# Patient Record
Sex: Male | Born: 1995 | Race: White | Hispanic: No | Marital: Single | Smoking: Current every day smoker
Health system: Southern US, Community
[De-identification: ages and names within clinical notes are randomized; demographics above are authoritative.]

## PROBLEM LIST (undated history)

## (undated) DIAGNOSIS — F909 Attention-deficit hyperactivity disorder, unspecified type: Secondary | ICD-10-CM

## (undated) DIAGNOSIS — F99 Mental disorder, not otherwise specified: Secondary | ICD-10-CM

## (undated) HISTORY — PX: TONSILLECTOMY: SUR1361

---
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
## 2015-02-08 ENCOUNTER — Encounter: Payer: Self-pay | Admitting: Emergency Medicine

## 2015-02-08 DIAGNOSIS — S060X0A Concussion without loss of consciousness, initial encounter: Secondary | ICD-10-CM | POA: Diagnosis not present

## 2015-02-08 DIAGNOSIS — S0990XA Unspecified injury of head, initial encounter: Secondary | ICD-10-CM | POA: Diagnosis present

## 2015-02-08 DIAGNOSIS — Y998 Other external cause status: Secondary | ICD-10-CM | POA: Insufficient documentation

## 2015-02-08 DIAGNOSIS — Z79899 Other long term (current) drug therapy: Secondary | ICD-10-CM | POA: Diagnosis not present

## 2015-02-08 DIAGNOSIS — Y9366 Activity, soccer: Secondary | ICD-10-CM | POA: Insufficient documentation

## 2015-02-08 DIAGNOSIS — W2102XA Struck by soccer ball, initial encounter: Secondary | ICD-10-CM | POA: Diagnosis not present

## 2015-02-08 DIAGNOSIS — Y92322 Soccer field as the place of occurrence of the external cause: Secondary | ICD-10-CM | POA: Diagnosis not present

## 2015-02-08 NOTE — ED Notes (Signed)
Patient ambulatory to triage with steady gait, without difficulty or distress noted; pt reports hit to bridge of nose with soccer ball at 10pm ; denies LOC but c/o generalized HA since

## 2015-02-09 ENCOUNTER — Emergency Department
Admission: EM | Admit: 2015-02-09 | Discharge: 2015-02-09 | Disposition: A | Discharge status: Home / Self Care | Payer: PRIVATE HEALTH INSURANCE | Attending: Emergency Medicine | Admitting: Emergency Medicine

## 2015-02-09 ENCOUNTER — Emergency Department: Payer: PRIVATE HEALTH INSURANCE

## 2015-02-09 DIAGNOSIS — S060X0A Concussion without loss of consciousness, initial encounter: Secondary | ICD-10-CM

## 2015-02-09 MED ORDER — IBUPROFEN 800 MG PO TABS
ORAL_TABLET | ORAL | Status: AC
  Filled 2015-02-09: qty 1

## 2015-02-09 MED ORDER — IBUPROFEN 800 MG PO TABS
800.0000 mg | ORAL_TABLET | ORAL | Status: AC
  Administered 2015-02-09: 800 mg via ORAL

## 2015-02-09 NOTE — Discharge Instructions (Signed)
Concussion, Adult  A concussion, or closed-head injury, is a brain injury caused by a direct blow to the head or by a quick and sudden movement (jolt) of the head or neck. Concussions are usually not life-threatening. Even so, the effects of a concussion can be serious. If you have had a concussion before, you are more likely to experience concussion-like symptoms after a direct blow to the head.   CAUSES  · Direct blow to the head, such as from running into another player during a soccer game, being hit in a fight, or hitting your head on a hard surface.  · A jolt of the head or neck that causes the brain to move back and forth inside the skull, such as in a car crash.  SIGNS AND SYMPTOMS  The signs of a concussion can be hard to notice. Early on, they may be missed by you, family members, and health care providers. You may look fine but act or feel differently.  Symptoms are usually temporary, but they may last for days, weeks, or even longer. Some symptoms may appear right away while others may not show up for hours or days. Every head injury is different. Symptoms include:  · Mild to moderate headaches that will not go away.  · A feeling of pressure inside your head.  · Having more trouble than usual:    Learning or remembering things you have heard.    Answering questions.    Paying attention or concentrating.    Organizing daily tasks.    Making decisions and solving problems.  · Slowness in thinking, acting or reacting, speaking, or reading.  · Getting lost or being easily confused.  · Feeling tired all the time or lacking energy (fatigued).  · Feeling drowsy.  · Sleep disturbances.    Sleeping more than usual.    Sleeping less than usual.    Trouble falling asleep.    Trouble sleeping (insomnia).  · Loss of balance or feeling lightheaded or dizzy.  · Nausea or vomiting.  · Numbness or tingling.  · Increased sensitivity to:    Sounds.    Lights.    Distractions.  · Vision problems or eyes that tire  easily.  · Diminished sense of taste or smell.  · Ringing in the ears.  · Mood changes such as feeling sad or anxious.  · Becoming easily irritated or angry for little or no reason.  · Lack of motivation.  · Seeing or hearing things other people do not see or hear (hallucinations).  DIAGNOSIS  Your health care provider can usually diagnose a concussion based on a description of your injury and symptoms. He or she will ask whether you passed out (lost consciousness) and whether you are having trouble remembering events that happened right before and during your injury.  Your evaluation might include:  · A brain scan to look for signs of injury to the brain. Even if the test shows no injury, you may still have a concussion.  · Blood tests to be sure other problems are not present.  TREATMENT  · Concussions are usually treated in an emergency department, in urgent care, or at a clinic. You may need to stay in the hospital overnight for further treatment.  · Tell your health care provider if you are taking any medicines, including prescription medicines, over-the-counter medicines, and natural remedies. Some medicines, such as blood thinners (anticoagulants) and aspirin, may increase the chance of complications. Also tell your health care   provider whether you have had alcohol or are taking illegal drugs. This information may affect treatment.  · Your health care provider will send you home with important instructions to follow.  · How fast you will recover from a concussion depends on many factors. These factors include how severe your concussion is, what part of your brain was injured, your age, and how healthy you were before the concussion.  · Most people with mild injuries recover fully. Recovery can take time. In general, recovery is slower in older persons. Also, persons who have had a concussion in the past or have other medical problems may find that it takes longer to recover from their current injury.  HOME  CARE INSTRUCTIONS  General Instructions  · Carefully follow the directions your health care provider gave you.  · Only take over-the-counter or prescription medicines for pain, discomfort, or fever as directed by your health care provider.  · Take only those medicines that your health care provider has approved.  · Do not drink alcohol until your health care provider says you are well enough to do so. Alcohol and certain other drugs may slow your recovery and can put you at risk of further injury.  · If it is harder than usual to remember things, write them down.  · If you are easily distracted, try to do one thing at a time. For example, do not try to watch TV while fixing dinner.  · Talk with family members or close friends when making important decisions.  · Keep all follow-up appointments. Repeated evaluation of your symptoms is recommended for your recovery.  · Watch your symptoms and tell others to do the same. Complications sometimes occur after a concussion. Older adults with a brain injury may have a higher risk of serious complications, such as a blood clot on the brain.  · Tell your teachers, school nurse, school counselor, coach, athletic trainer, or work manager about your injury, symptoms, and restrictions. Tell them about what you can or cannot do. They should watch for:    Increased problems with attention or concentration.    Increased difficulty remembering or learning new information.    Increased time needed to complete tasks or assignments.    Increased irritability or decreased ability to cope with stress.    Increased symptoms.  · Rest. Rest helps the brain to heal. Make sure you:    Get plenty of sleep at night. Avoid staying up late at night.    Keep the same bedtime hours on weekends and weekdays.    Rest during the day. Take daytime naps or rest breaks when you feel tired.  · Limit activities that require a lot of thought or concentration. These include:    Doing homework or job-related  work.    Watching TV.    Working on the computer.  · Avoid any situation where there is potential for another head injury (football, hockey, soccer, basketball, martial arts, downhill snow sports and horseback riding). Your condition will get worse every time you experience a concussion. You should avoid these activities until you are evaluated by the appropriate follow-up health care providers.  Returning To Your Regular Activities  You will need to return to your normal activities slowly, not all at once. You must give your body and brain enough time for recovery.  · Do not return to sports or other athletic activities until your health care provider tells you it is safe to do so.  · Ask   your health care provider when you can drive, ride a bicycle, or operate heavy machinery. Your ability to react may be slower after a brain injury. Never do these activities if you are dizzy.  · Ask your health care provider about when you can return to work or school.  Preventing Another Concussion  It is very important to avoid another brain injury, especially before you have recovered. In rare cases, another injury can lead to permanent brain damage, brain swelling, or death. The risk of this is greatest during the first 7-10 days after a head injury. Avoid injuries by:  · Wearing a seat belt when riding in a car.  · Drinking alcohol only in moderation.  · Wearing a helmet when biking, skiing, skateboarding, skating, or doing similar activities.  · Avoiding activities that could lead to a second concussion, such as contact or recreational sports, until your health care provider says it is okay.  · Taking safety measures in your home.    Remove clutter and tripping hazards from floors and stairways.    Use grab bars in bathrooms and handrails by stairs.    Place non-slip mats on floors and in bathtubs.    Improve lighting in dim areas.  SEEK MEDICAL CARE IF:  · You have increased problems paying attention or  concentrating.  · You have increased difficulty remembering or learning new information.  · You need more time to complete tasks or assignments than before.  · You have increased irritability or decreased ability to cope with stress.  · You have more symptoms than before.  Seek medical care if you have any of the following symptoms for more than 2 weeks after your injury:  · Lasting (chronic) headaches.  · Dizziness or balance problems.  · Nausea.  · Vision problems.  · Increased sensitivity to noise or light.  · Depression or mood swings.  · Anxiety or irritability.  · Memory problems.  · Difficulty concentrating or paying attention.  · Sleep problems.  · Feeling tired all the time.  SEEK IMMEDIATE MEDICAL CARE IF:  · You have severe or worsening headaches. These may be a sign of a blood clot in the brain.  · You have weakness (even if only in one hand, leg, or part of the face).  · You have numbness.  · You have decreased coordination.  · You vomit repeatedly.  · You have increased sleepiness.  · One pupil is larger than the other.  · You have convulsions.  · You have slurred speech.  · You have increased confusion. This may be a sign of a blood clot in the brain.  · You have increased restlessness, agitation, or irritability.  · You are unable to recognize people or places.  · You have neck pain.  · It is difficult to wake you up.  · You have unusual behavior changes.  · You lose consciousness.  MAKE SURE YOU:  · Understand these instructions.  · Will watch your condition.  · Will get help right away if you are not doing well or get worse.     This information is not intended to replace advice given to you by your health care provider. Make sure you discuss any questions you have with your health care provider.     Document Released: 06/02/2003 Document Revised: 04/02/2014 Document Reviewed: 10/02/2012  Elsevier Interactive Patient Education ©2016 [employer]

## 2015-02-09 NOTE — ED Notes (Signed)
Pt dc home ambulatory with friend instructed on follow up plan PT NAD AT DC

## 2015-02-09 NOTE — ED Provider Notes (Signed)
[facility] Emergency Department Provider Note  ____________________________________________  Time seen: 2:14 AM  I have reviewed the triage vital signs and the nursing notes.   HISTORY  Chief Complaint Headache      HPI Cory Joseph is a 19 y.o. male presents with history of being struck on the bridge of the nose today by a soccer ball at 10 PM. Patient denies any loss of consciousness at the time however does complain of generalized headache mainly frontal and occipital and unsteady gait. Patient denies any leakage of  fluid from the nose    History reviewed. No pertinent past medical history.  Patient Active Problem List   Diagnosis Date Noted  . Pain in shin 03/18/2012  . Gait abnormality 03/18/2012    Past Surgical History  Procedure Laterality Date  . Tonsillectomy      Current Outpatient Rx  Name  Route  Sig  Dispense  Refill  . cefPROZIL (CEFZIL) 250 MG/5ML suspension            0   . CONCERTA 54 MG CR tablet   Oral   Take 54 mg by mouth every morning.      0     Dispense as written.   . methylphenidate (RITALIN) 5 MG tablet      take 1 to 2 tablets by mouth AFTER SCHOOL      0   . montelukast (SINGULAIR) 10 MG tablet   Oral   Take 10 mg by mouth every morning.      0   . traMADol (ULTRAM) 50 MG tablet      take 1 tablet by mouth every 4 to 6 hours if needed for pain      0     Allergies Hydrocodeine  No family history on file.  Social History Social History  Substance Use Topics  . Smoking status: Never Smoker   . Smokeless tobacco: None  . Alcohol Use: None    Review of Systems  Constitutional: Negative for fever. Eyes: Negative for visual changes. ENT: Negative for sore throat. Cardiovascular: Negative for chest pain. Respiratory: Negative for shortness of breath. Gastrointestinal: Negative for abdominal pain, vomiting and diarrhea. Genitourinary: Negative for dysuria. Musculoskeletal:  Negative for back pain. Skin: Negative for rash. Neurological: Positive for headache and unsteady gait.   10-point ROS otherwise negative.  ____________________________________________   PHYSICAL EXAM:  VITAL SIGNS: ED Triage Vitals  Enc Vitals Group     BP 02/09/15 0208 139/79 mmHg     Pulse Rate 02/09/15 0208 72     Resp 02/09/15 0208 16     Temp --      Temp src --      SpO2 02/09/15 0208 100 %     Weight --      Height --      Head Cir --      Peak Flow --      Pain Score 02/08/15 2345 7     Pain Loc --      Pain Edu? --      Excl. in GC? --      Constitutional: Alert and oriented. Well appearing and in no distress. Eyes: Conjunctivae are normal. PERRL. Normal extraocular movements. ENT   Head: Normocephalic and atraumatic.   Nose: No congestion/rhinnorhea.   Mouth/Throat: Mucous membranes are moist.   Neck: No stridor. Hematological/Lymphatic/Immunilogical: No cervical lymphadenopathy. Cardiovascular: Normal rate, regular rhythm. Normal and symmetric distal pulses are present in all  extremities. No murmurs, rubs, or gallops. Respiratory: Normal respiratory effort without tachypnea nor retractions. Breath sounds are clear and equal bilaterally. No wheezes/rales/rhonchi. Gastrointestinal: Soft and nontender. No distention. There is no CVA tenderness. Genitourinary: deferred Musculoskeletal: Nontender with normal range of motion in all extremities. No joint effusions.  No lower extremity tenderness nor edema. Neurologic:  Normal speech and language. No gross focal neurologic deficits are appreciated. Speech is normal.  Skin:  Skin is warm, dry and intact. No rash noted. Psychiatric: Mood and affect are normal. Speech and behavior are normal. Patient exhibits appropriate insight and judgment.   RADIOLOGY  CT Head Wo Contrast (Final result) Result time: 02/09/15 02:46:25   Final result by Rad Results In Interface (02/09/15 02:46:25)   Narrative:    CLINICAL DATA: Generalized headache after being hit in bridge of nose with soccer ball. Initial encounter.  EXAM: CT HEAD WITHOUT CONTRAST  TECHNIQUE: Contiguous axial images were obtained from the base of the skull through the vertex without intravenous contrast.  COMPARISON: None.  FINDINGS: There is no evidence of acute infarction, mass lesion, or intra- or extra-axial hemorrhage on CT.  The posterior fossa, including the cerebellum, brainstem and fourth ventricle, is within normal limits. The third and lateral ventricles, and basal ganglia are unremarkable in appearance. The cerebral hemispheres are symmetric in appearance, with normal gray-white differentiation. No mass effect or midline shift is seen.  There is no evidence of fracture; visualized osseous structures are unremarkable in appearance. The visualized portions of the orbits are within normal limits. The paranasal sinuses and mastoid air cells are well-aerated. No significant soft tissue abnormalities are seen.  IMPRESSION: No evidence of traumatic intracranial injury or fracture.   Electronically Signed By: Roanna RaiderJeffery Chang M.D. On: 02/09/2015 02:46    INITIAL IMPRESSION / ASSESSMENT AND PLAN / ED COURSE  Pertinent labs & imaging results that were available during my care of the patient were reviewed by me and considered in my medical decision making (see chart for details).  History and physical exam consistent with possible concussion. As such patient was advised to avoid contact sports for at least 2 weeks to limit possibility of a recurrent headache injury. Patient recommended to follow up with primary care provider  ____________________________________________   FINAL CLINICAL IMPRESSION(S) / ED DIAGNOSES  Final diagnoses:  Concussion, without loss of consciousness, initial encounter      Darci Currentandolph N Brown, MD 02/09/15 [phone]

## 2015-02-09 NOTE — ED Notes (Addendum)
MD at bedside. 

## 2015-05-10 ENCOUNTER — Encounter: Payer: Self-pay | Admitting: Emergency Medicine

## 2015-05-10 ENCOUNTER — Emergency Department
Admission: EM | Admit: 2015-05-10 | Discharge: 2015-05-10 | Disposition: A | Discharge status: Home / Self Care | Payer: PRIVATE HEALTH INSURANCE | Attending: Emergency Medicine | Admitting: Emergency Medicine

## 2015-05-10 ENCOUNTER — Emergency Department: Payer: PRIVATE HEALTH INSURANCE

## 2015-05-10 DIAGNOSIS — Y998 Other external cause status: Secondary | ICD-10-CM | POA: Insufficient documentation

## 2015-05-10 DIAGNOSIS — Y9367 Activity, basketball: Secondary | ICD-10-CM | POA: Insufficient documentation

## 2015-05-10 DIAGNOSIS — Z79899 Other long term (current) drug therapy: Secondary | ICD-10-CM | POA: Insufficient documentation

## 2015-05-10 DIAGNOSIS — Y92310 Basketball court as the place of occurrence of the external cause: Secondary | ICD-10-CM | POA: Diagnosis not present

## 2015-05-10 DIAGNOSIS — S060X0A Concussion without loss of consciousness, initial encounter: Secondary | ICD-10-CM | POA: Diagnosis not present

## 2015-05-10 DIAGNOSIS — S0990XA Unspecified injury of head, initial encounter: Secondary | ICD-10-CM | POA: Diagnosis present

## 2015-05-10 DIAGNOSIS — W01198A Fall on same level from slipping, tripping and stumbling with subsequent striking against other object, initial encounter: Secondary | ICD-10-CM | POA: Insufficient documentation

## 2015-05-10 NOTE — ED Notes (Signed)
Pt arrived to the ED for complaints of having a headache for 4 days after sustaining a head injury while playing basketball. Pt states that he got hit by a basketball, denies LOC and states that came because he had a concussion in the past. Reports that Tylenol helps with the headache. Pt is AOx4 in no apparent distress.

## 2015-05-10 NOTE — ED Provider Notes (Signed)
[facility] Emergency Department Provider Note  ____________________________________________  Time seen: Approximately 8:48 PM  I have reviewed the triage vital signs and the nursing notes.   HISTORY  Chief Complaint Headache and Head Injury    HPI ISMAEL KARGE is a 20 y.o. male who fell from a standing position while playing basketball 4 days ago. He landed on his side and his head hit the concrete on the left temple region. No loss of consciousness or nausea or vomiting. He felt dazed and some vision changes. He presents to the emergency room because his pain persist and he has been acting out of character. No loss of balance. He has been able to perform his normal school functions at [employer]. He has a history of concussion approximate 2 months ago.   History reviewed. No pertinent past medical history.  Patient Active Problem List   Diagnosis Date Noted  . Pain in shin 03/18/2012  . Gait abnormality 03/18/2012    Past Surgical History  Procedure Laterality Date  . Tonsillectomy      Current Outpatient Rx  Name  Route  Sig  Dispense  Refill  . cefPROZIL (CEFZIL) 250 MG/5ML suspension            0   . CONCERTA 54 MG CR tablet   Oral   Take 54 mg by mouth every morning.      0     Dispense as written.   . methylphenidate (RITALIN) 5 MG tablet      take 1 to 2 tablets by mouth AFTER SCHOOL      0   . montelukast (SINGULAIR) 10 MG tablet   Oral   Take 10 mg by mouth every morning.      0   . traMADol (ULTRAM) 50 MG tablet      take 1 tablet by mouth every 4 to 6 hours if needed for pain      0     Allergies Hydrocodeine  History reviewed. No pertinent family history.  Social History Social History  Substance Use Topics  . Smoking status: Never Smoker   . Smokeless tobacco: None  . Alcohol Use: Yes    Review of Systems Constitutional: No fever/chills Eyes: No visual changes. ENT: No sore  throat. Cardiovascular: Denies chest pain. Respiratory: Denies shortness of breath. Gastrointestinal: No abdominal pain.  No nausea, no vomiting.  No diarrhea.  No constipation. Genitourinary: Negative for dysuria. Musculoskeletal: Negative for back pain. Skin: Negative for rash. Neurological: Negative for headaches, focal weakness or numbness. Cranial nerves II through XII grossly intact. Neg Romberg. Normal cerebellar function. Normal gait. 10-point ROS otherwise negative.  ____________________________________________   PHYSICAL EXAM:  VITAL SIGNS: ED Triage Vitals  Enc Vitals Group     BP 05/10/15 1925 141/73 mmHg     Pulse Rate 05/10/15 1925 52     Resp 05/10/15 1925 16     Temp 05/10/15 1925 97.7 F (36.5 C)     Temp Source 05/10/15 1925 Oral     SpO2 05/10/15 1925 98 %     Weight 05/10/15 1925 185 lb (83.915 kg)     Height 05/10/15 1925  (1.753 m)     Head Cir --      Peak Flow --      Pain Score 05/10/15 1926 8     Pain Loc --      Pain Edu? --      Excl. in GC? --  Constitutional: Alert and oriented. Well appearing and in no acute distress. Eyes: Conjunctivae are normal. PERRL. EOMI. Ears:  Clear with normal landmarks. No erythema. Head: Atraumatic. Nose: No congestion/rhinnorhea. Mouth/Throat: Mucous membranes are moist.  Oropharynx non-erythematous. No lesions. Neck:  Supple.  No adenopathy.  No cervical spine tenderness to palpation. Cardiovascular: Normal rate, regular rhythm. Grossly normal heart sounds.  Good peripheral circulation. Respiratory: Normal respiratory effort.  No retractions. Lungs CTAB. Gastrointestinal: Soft and nontender. No distention. No abdominal bruits. No CVA tenderness. Musculoskeletal: Nml ROM of upper and lower extremity joints. Neurologic:  Normal speech and language. No gross focal neurologic deficits are appreciated. No gait instability. Skin:  Skin is warm, dry and intact. No rash noted. Psychiatric: Mood and affect are  normal. Speech and behavior are normal.  ____________________________________________   LABS (all labs ordered are listed, but only abnormal results are displayed)  Labs Reviewed - No data to display ____________________________________________  EKG   ____________________________________________  RADIOLOGY  CLINICAL DATA: Headache for 4 days since a head injury playing basketball. Initial encounter.  EXAM: CT HEAD WITHOUT CONTRAST  TECHNIQUE: Contiguous axial images were obtained from the base of the skull through the vertex without intravenous contrast.  COMPARISON: Head CT scan 02/09/2008.  FINDINGS: The brain appears normal without hemorrhage, infarct, mass lesion, mass effect, midline shift or abnormal extra-axial fluid collection. No hydrocephalus or pneumocephalus. The calvarium is intact. Imaged paranasal sinuses and mastoid air cells are clear.  IMPRESSION: Negative head CT.   Electronically Signed  By: Drusilla Kanner M.D.  On: 05/10/2015 20:52  ____________________________________________   PROCEDURES  Procedure(s) performed: None  Critical Care performed: No  ____________________________________________   INITIAL IMPRESSION / ASSESSMENT AND PLAN / ED COURSE  Pertinent labs & imaging results that were available during my care of the patient were reviewed by me and considered in my medical decision making (see chart for details).  20 year old who suffered a fall while playing basketball hitting his head on the ground. Presents with persistent pain to the left temple with headache and behaviors out of character. CT of the head was negative. Treated for concussion with close follow-up by his pediatrician. Encouraged no sport activities or intense mental activities. ____________________________________________   FINAL CLINICAL IMPRESSION(S) / ED DIAGNOSES  Final diagnoses:  Concussion, without loss of consciousness, initial  encounter      Ignacia Bayley, PA-C 05/10/15 2106  Sharman Cheek, MD 05/10/15 2317

## 2015-05-10 NOTE — Discharge Instructions (Signed)
Concussion, Adult A concussion, or closed-head injury, is a brain injury caused by a direct blow to the head or by a quick and sudden movement (jolt) of the head or neck. Concussions are usually not life-threatening. Even so, the effects of a concussion can be serious. If you have had a concussion before, you are more likely to experience concussion-like symptoms after a direct blow to the head.  CAUSES  Direct blow to the head, such as from running into another player during a soccer game, being hit in a fight, or hitting your head on a hard surface.  A jolt of the head or neck that causes the brain to move back and forth inside the skull, such as in a car crash. SIGNS AND SYMPTOMS The signs of a concussion can be hard to notice. Early on, they may be missed by you, family members, and health care providers. You may look fine but act or feel differently. Symptoms are usually temporary, but they may last for days, weeks, or even longer. Some symptoms may appear right away while others may not show up for hours or days. Every head injury is different. Symptoms include:  Mild to moderate headaches that will not go away.  A feeling of pressure inside your head.  Having more trouble than usual:  Learning or remembering things you have heard.  Answering questions.  Paying attention or concentrating.  Organizing daily tasks.  Making decisions and solving problems.  Slowness in thinking, acting or reacting, speaking, or reading.  Getting lost or being easily confused.  Feeling tired all the time or lacking energy (fatigued).  Feeling drowsy.  Sleep disturbances.  Sleeping more than usual.  Sleeping less than usual.  Trouble falling asleep.  Trouble sleeping (insomnia).  Loss of balance or feeling lightheaded or dizzy.  Nausea or vomiting.  Numbness or tingling.  Increased sensitivity to:  Sounds.  Lights.  Distractions.  Vision problems or eyes that tire  easily.  Diminished sense of taste or smell.  Ringing in the ears.  Mood changes such as feeling sad or anxious.  Becoming easily irritated or angry for little or no reason.  Lack of motivation.  Seeing or hearing things other people do not see or hear (hallucinations). DIAGNOSIS Your health care provider can usually diagnose a concussion based on a description of your injury and symptoms. He or she will ask whether you passed out (lost consciousness) and whether you are having trouble remembering events that happened right before and during your injury. Your evaluation might include:  A brain scan to look for signs of injury to the brain. Even if the test shows no injury, you may still have a concussion.  Blood tests to be sure other problems are not present. TREATMENT  Concussions are usually treated in an emergency department, in urgent care, or at a clinic. You may need to stay in the hospital overnight for further treatment.  Tell your health care provider if you are taking any medicines, including prescription medicines, over-the-counter medicines, and natural remedies. Some medicines, such as blood thinners (anticoagulants) and aspirin, may increase the chance of complications. Also tell your health care provider whether you have had alcohol or are taking illegal drugs. This information may affect treatment.  Your health care provider will send you home with important instructions to follow.  How fast you will recover from a concussion depends on many factors. These factors include how severe your concussion is, what part of your brain was injured,  your age, and how healthy you were before the concussion.  Most people with mild injuries recover fully. Recovery can take time. In general, recovery is slower in older persons. Also, persons who have had a concussion in the past or have other medical problems may find that it takes longer to recover from their current injury. HOME  CARE INSTRUCTIONS General Instructions  Carefully follow the directions your health care provider gave you.  Only take over-the-counter or prescription medicines for pain, discomfort, or fever as directed by your health care provider.  Take only those medicines that your health care provider has approved.  Do not drink alcohol until your health care provider says you are well enough to do so. Alcohol and certain other drugs may slow your recovery and can put you at risk of further injury.  If it is harder than usual to remember things, write them down.  If you are easily distracted, try to do one thing at a time. For example, do not try to watch TV while fixing dinner.  Talk with family members or close friends when making important decisions.  Keep all follow-up appointments. Repeated evaluation of your symptoms is recommended for your recovery.  Watch your symptoms and tell others to do the same. Complications sometimes occur after a concussion. Older adults with a brain injury may have a higher risk of serious complications, such as a blood clot on the brain.  Tell your teachers, school nurse, school counselor, coach, athletic trainer, or work Freight forwarder about your injury, symptoms, and restrictions. Tell them about what you can or cannot do. They should watch for:  Increased problems with attention or concentration.  Increased difficulty remembering or learning new information.  Increased time needed to complete tasks or assignments.  Increased irritability or decreased ability to cope with stress.  Increased symptoms.  Rest. Rest helps the brain to heal. Make sure you:  Get plenty of sleep at night. Avoid staying up late at night.  Keep the same bedtime hours on weekends and weekdays.  Rest during the day. Take daytime naps or rest breaks when you feel tired.  Limit activities that require a lot of thought or concentration. These include:  Doing homework or job-related  work.  Watching TV.  Working on the computer.  Avoid any situation where there is potential for another head injury (football, hockey, soccer, basketball, martial arts, downhill snow sports and horseback riding). Your condition will get worse every time you experience a concussion. You should avoid these activities until you are evaluated by the appropriate follow-up health care providers. Returning To Your Regular Activities You will need to return to your normal activities slowly, not all at once. You must give your body and brain enough time for recovery.  Do not return to sports or other athletic activities until your health care provider tells you it is safe to do so.  Ask your health care provider when you can drive, ride a bicycle, or operate heavy machinery. Your ability to react may be slower after a brain injury. Never do these activities if you are dizzy.  Ask your health care provider about when you can return to work or school. Preventing Another Concussion It is very important to avoid another brain injury, especially before you have recovered. In rare cases, another injury can lead to permanent brain damage, brain swelling, or death. The risk of this is greatest during the first 7-10 days after a head injury. Avoid injuries by:  Wearing a  seat belt when riding in a car.  Drinking alcohol only in moderation.  Wearing a helmet when biking, skiing, skateboarding, skating, or doing similar activities.  Avoiding activities that could lead to a second concussion, such as contact or recreational sports, until your health care provider says it is okay.  Taking safety measures in your home.  Remove clutter and tripping hazards from floors and stairways.  Use grab bars in bathrooms and handrails by stairs.  Place non-slip mats on floors and in bathtubs.  Improve lighting in dim areas. SEEK MEDICAL CARE IF:  You have increased problems paying attention or  concentrating.  You have increased difficulty remembering or learning new information.  You need more time to complete tasks or assignments than before.  You have increased irritability or decreased ability to cope with stress.  You have more symptoms than before. Seek medical care if you have any of the following symptoms for more than 2 weeks after your injury:  Lasting (chronic) headaches.  Dizziness or balance problems.  Nausea.  Vision problems.  Increased sensitivity to noise or light.  Depression or mood swings.  Anxiety or irritability.  Memory problems.  Difficulty concentrating or paying attention.  Sleep problems.  Feeling tired all the time. SEEK IMMEDIATE MEDICAL CARE IF:  You have severe or worsening headaches. These may be a sign of a blood clot in the brain.  You have weakness (even if only in one hand, leg, or part of the face).  You have numbness.  You have decreased coordination.  You vomit repeatedly.  You have increased sleepiness.  One pupil is larger than the other.  You have convulsions.  You have slurred speech.  You have increased confusion. This may be a sign of a blood clot in the brain.  You have increased restlessness, agitation, or irritability.  You are unable to recognize people or places.  You have neck pain.  It is difficult to wake you up.  You have unusual behavior changes.  You lose consciousness. MAKE SURE YOU:  Understand these instructions.  Will watch your condition.  Will get help right away if you are not doing well or get worse.   This information is not intended to replace advice given to you by your health care provider. Make sure you discuss any questions you have with your health care provider.   Document Released: 06/02/2003 Document Revised: 04/02/2014 Document Reviewed: 10/02/2012 Elsevier Interactive Patient Education 2016 [employer]   Post-Concussion Syndrome Post-concussion  syndrome describes the symptoms that can occur after a head injury. These symptoms can last from weeks to months. CAUSES  It is not clear why some head injuries cause post-concussion syndrome. It can occur whether your head injury was mild or severe and whether you were wearing head protection or not.  SIGNS AND SYMPTOMS  Memory difficulties.  Dizziness.  Headaches.  Double vision or blurry vision.  Sensitivity to light.  Hearing difficulties.  Depression.  Tiredness.  Weakness.  Difficulty with concentration.  Difficulty sleeping or staying asleep.  Vomiting.  Poor balance or instability on your feet.  Slow reaction time.  Difficulty learning and remembering things you have heard. DIAGNOSIS  There is no test to determine whether you have post-concussion syndrome. Your health care provider may order an imaging scan of your brain, such as a CT scan, to check for other problems that may be causing your symptoms (such as a severe injury inside your skull). TREATMENT  Usually, these problems disappear over  time without medical care. Your health care provider may prescribe medicine to help ease your symptoms. It is important to follow up with a neurologist to evaluate your recovery and address any lingering symptoms or issues. HOME CARE INSTRUCTIONS   Take medicines only as directed by your health care provider. Do not take aspirin. Aspirin can slow blood clotting.  Sleep with your head slightly elevated to help with headaches.  Avoid any situation where there is potential for another head injury. This includes football, hockey, soccer, basketball, martial arts, downhill snow sports, and horseback riding. Your condition will get worse every time you experience a concussion. You should avoid these activities until you are evaluated by the appropriate follow-up health care providers.  Keep all follow-up visits as directed by your health care provider. This is important. SEEK  MEDICAL CARE IF:  You have increased problems paying attention or concentrating.  You have increased difficulty remembering or learning new information.  You need more time to complete tasks or assignments than before.  You have increased irritability or decreased ability to cope with stress.  You have more symptoms than before. Seek medical care if you have any of the following symptoms for more than two weeks after your injury:  Lasting (chronic) headaches.  Dizziness or balance problems.  Nausea.  Vision problems.  Increased sensitivity to noise or light.  Depression or mood swings.  Anxiety or irritability.  Memory problems.  Difficulty concentrating or paying attention.  Sleep problems.  Feeling tired all the time. SEEK IMMEDIATE MEDICAL CARE IF:  You have confusion or unusual drowsiness.  Others find it difficult to wake you up.  You have nausea or persistent, forceful vomiting.  You feel like you are moving when you are not (vertigo). Your eyes may move rapidly back and forth.  You have convulsions or faint.  You have severe, persistent headaches that are not relieved by medicine.  You cannot use your arms or legs normally.  One of your pupils is larger than the other.  You have clear or bloody discharge from your nose or ears.  Your problems are getting worse, not better. MAKE SURE YOU:  Understand these instructions.  Will watch your condition.  Will get help right away if you are not doing well or get worse.   This information is not intended to replace advice given to you by your health care provider. Make sure you discuss any questions you have with your health care provider.   Document Released: 09/01/2001 Document Revised: 04/02/2014 Document Reviewed: 06/17/2013 Elsevier Interactive Patient Education 2016 [employer].  You may take ibuprofen as needed for headache. Follow-up with your physician next week for further evaluation.

## 2016-03-16 ENCOUNTER — Ambulatory Visit (INDEPENDENT_AMBULATORY_CARE_PROVIDER_SITE_OTHER): Payer: PRIVATE HEALTH INSURANCE | Admitting: Psychology

## 2016-03-16 DIAGNOSIS — F411 Generalized anxiety disorder: Secondary | ICD-10-CM

## 2016-03-22 ENCOUNTER — Ambulatory Visit (INDEPENDENT_AMBULATORY_CARE_PROVIDER_SITE_OTHER): Payer: PRIVATE HEALTH INSURANCE | Admitting: Psychology

## 2016-03-22 DIAGNOSIS — F411 Generalized anxiety disorder: Secondary | ICD-10-CM | POA: Diagnosis not present

## 2016-04-09 ENCOUNTER — Ambulatory Visit (INDEPENDENT_AMBULATORY_CARE_PROVIDER_SITE_OTHER): Payer: PRIVATE HEALTH INSURANCE | Admitting: Psychology

## 2016-04-09 DIAGNOSIS — F411 Generalized anxiety disorder: Secondary | ICD-10-CM

## 2016-04-20 ENCOUNTER — Ambulatory Visit (INDEPENDENT_AMBULATORY_CARE_PROVIDER_SITE_OTHER): Payer: PRIVATE HEALTH INSURANCE | Admitting: Psychology

## 2016-04-20 DIAGNOSIS — F411 Generalized anxiety disorder: Secondary | ICD-10-CM

## 2016-05-03 ENCOUNTER — Ambulatory Visit (INDEPENDENT_AMBULATORY_CARE_PROVIDER_SITE_OTHER): Payer: PRIVATE HEALTH INSURANCE | Admitting: Psychology

## 2016-05-03 DIAGNOSIS — F411 Generalized anxiety disorder: Secondary | ICD-10-CM

## 2016-05-10 ENCOUNTER — Ambulatory Visit: Payer: PRIVATE HEALTH INSURANCE | Admitting: Psychology

## 2016-05-22 ENCOUNTER — Ambulatory Visit (INDEPENDENT_AMBULATORY_CARE_PROVIDER_SITE_OTHER): Payer: PRIVATE HEALTH INSURANCE | Admitting: Psychology

## 2016-05-22 DIAGNOSIS — F411 Generalized anxiety disorder: Secondary | ICD-10-CM

## 2016-05-26 ENCOUNTER — Encounter: Payer: Self-pay | Admitting: Emergency Medicine

## 2016-05-26 ENCOUNTER — Emergency Department
Admission: EM | Admit: 2016-05-26 | Discharge: 2016-05-26 | Disposition: A | Discharge status: Home / Self Care | Payer: PRIVATE HEALTH INSURANCE | Attending: Emergency Medicine | Admitting: Emergency Medicine

## 2016-05-26 ENCOUNTER — Emergency Department: Payer: PRIVATE HEALTH INSURANCE

## 2016-05-26 DIAGNOSIS — Z79899 Other long term (current) drug therapy: Secondary | ICD-10-CM | POA: Insufficient documentation

## 2016-05-26 DIAGNOSIS — S60512A Abrasion of left hand, initial encounter: Secondary | ICD-10-CM | POA: Diagnosis not present

## 2016-05-26 DIAGNOSIS — S6991XA Unspecified injury of right wrist, hand and finger(s), initial encounter: Secondary | ICD-10-CM | POA: Diagnosis present

## 2016-05-26 DIAGNOSIS — W25XXXA Contact with sharp glass, initial encounter: Secondary | ICD-10-CM | POA: Diagnosis not present

## 2016-05-26 DIAGNOSIS — Y939 Activity, unspecified: Secondary | ICD-10-CM | POA: Diagnosis not present

## 2016-05-26 DIAGNOSIS — Y999 Unspecified external cause status: Secondary | ICD-10-CM | POA: Insufficient documentation

## 2016-05-26 DIAGNOSIS — S60511A Abrasion of right hand, initial encounter: Secondary | ICD-10-CM | POA: Insufficient documentation

## 2016-05-26 DIAGNOSIS — Y929 Unspecified place or not applicable: Secondary | ICD-10-CM | POA: Insufficient documentation

## 2016-05-26 HISTORY — DX: Mental disorder, not otherwise specified: F99

## 2016-05-26 NOTE — ED Notes (Signed)
Pt stating that he punched a mirror yesterday. Pt was [facility] and was told to come here for and xray of his right hand. Pt has multiple laceration to bilateral hands. [facility]. Pt in NAD at this time.

## 2016-05-26 NOTE — ED Provider Notes (Signed)
[facility] Emergency Department Provider Note  ____________________________________________  Time seen: Approximately 2:05 PM  I have reviewed the triage vital signs and the nursing notes.   HISTORY  Chief Complaint Hand Injury    HPI Cory Joseph is a 21 y.o. male that presents to the emergency department after punching a mirror with both hands. Patient states that he continues to have pain in his right hand at the base of his pinky finger. [facility] and was told that he might have a fracture and to come to the ED. Patient denies any additional injuries. Last tetanus shot was in 2014. No fever, shortness breath, chest pain, nausea, vomiting, abdominal pain, numbness, tingling.   Past Medical History:  Diagnosis Date  . Psychiatric disorder    emotional dysregulation disorder    Patient Active Problem List   Diagnosis Date Noted  . Pain in shin 03/18/2012  . Gait abnormality 03/18/2012    Past Surgical History:  Procedure Laterality Date  . TONSILLECTOMY      Prior to Admission medications   Medication Sig Start Date End Date Taking? Authorizing Provider  cefPROZIL (CEFZIL) 250 MG/5ML suspension  11/09/14   Historical Provider, MD  CONCERTA 54 MG CR tablet Take 54 mg by mouth every morning. 11/16/14   Historical Provider, MD  methylphenidate (RITALIN) 5 MG tablet take 1 to 2 tablets by mouth AFTER SCHOOL 11/16/14   Historical Provider, MD  montelukast (SINGULAIR) 10 MG tablet Take 10 mg by mouth every morning. 11/08/14   Historical Provider, MD  traMADol Janean Sark(ULTRAM) 50 MG tablet take 1 tablet by mouth every 4 to 6 hours if needed for pain 11/10/14   Historical Provider, MD    Allergies Hydrocodeine [dihydrocodeine]  No family history on file.  Social History Social History  Substance Use Topics  . Smoking status: Never Smoker  . Smokeless tobacco: Not on file  . Alcohol use Yes     Review of Systems   Constitutional: No fever/chills ENT: No upper respiratory complaints. Cardiovascular: No chest pain. Respiratory: No cough. No SOB. Gastrointestinal: No abdominal pain.  No nausea, no vomiting.  Skin: Negative for rash, abrasions, lacerations, ecchymosis. Neurological: Negative for headaches, numbness or tingling   ____________________________________________   PHYSICAL EXAM:  VITAL SIGNS: ED Triage Vitals [05/26/16 1321]  Enc Vitals Group     BP 119/70     Pulse Rate 80     Resp 16     Temp 97.6 F (36.4 C)     Temp Source Oral     SpO2 100 %     Weight 170 lb (77.1 kg)     Height 5\' 9"  (1.753 m)     Head Circumference      Peak Flow      Pain Score 0     Pain Loc      Pain Edu?      Excl. in GC?      Constitutional: Alert and oriented. Well appearing and in no acute distress. Eyes: Conjunctivae are normal. PERRL. EOMI. Head: Atraumatic. ENT:      Ears:      Nose: No congestion/rhinnorhea.      Mouth/Throat: Mucous membranes are moist.  Neck: No stridor.   Cardiovascular: Normal rate, regular rhythm.  Good peripheral circulation. Respiratory: Normal respiratory effort without tachypnea or retractions. Lungs CTAB. Good air entry to the bases with no decreased or absent breath sounds. Musculoskeletal: Full range of motion to all  extremities. No gross deformities appreciated. Tender to palpation over fifth metacarpal. Neurologic:  Normal speech and language. No gross focal neurologic deficits are appreciated.  Skin:  Skin is warm, dry. Several small scratches across bilateral knuckles. No glass noted.  Psychiatric: Mood and affect are normal. Speech and behavior are normal. Patient exhibits appropriate insight and judgement.   ____________________________________________   LABS (all labs ordered are listed, but only abnormal results are displayed)  Labs Reviewed - No data to  display ____________________________________________  EKG   ____________________________________________  RADIOLOGY Lexine Baton, personally viewed and evaluated these images (plain radiographs) as part of my medical decision making, as well as reviewing the written report by the radiologist.  Dg Hand Complete Right  Result Date: 05/26/2016 CLINICAL DATA:  Punched a mirror yesterday. Multiple right hand lacerations. Initial encounter. EXAM: RIGHT HAND - COMPLETE 3+ VIEW COMPARISON:  None. FINDINGS: There is no evidence of fracture or dislocation. There is no evidence of arthropathy or other focal bone abnormality. Soft tissues are unremarkable. IMPRESSION: Negative for fracture or opaque foreign body. Electronically Signed   By: Marnee Spring M.D.   On: 05/26/2016 14:08    ____________________________________________    PROCEDURES  Procedure(s) performed:    Procedures    Medications - No data to display   ____________________________________________   INITIAL IMPRESSION / ASSESSMENT AND PLAN / ED COURSE  Pertinent labs & imaging results that were available during my care of the patient were reviewed by me and considered in my medical decision making (see chart for details).  Review of the [place] CSRS was performed in accordance of the [facility] prior to dispensing any controlled drugs.     Patient's diagnosis is consistent with hand pain after punching a mirror. Vital signs and exam are reassuring. X-ray negative for acute bony abnormalities. Patient will continue putting Neosporin on scratches. Patient is to follow up with PCP as directed. Patient is given ED precautions to return to the ED for any worsening or new symptoms.     ____________________________________________  FINAL CLINICAL IMPRESSION(S) / ED DIAGNOSES  Final diagnoses:  Injury of right hand, initial encounter      NEW MEDICATIONS STARTED DURING THIS VISIT:  Discharge Medication List as of  05/26/2016  3:05 PM          This chart was dictated using voice recognition software/Dragon. Despite best efforts to proofread, errors can occur which can change the meaning. Any change was purely unintentional.    Enid Derry, PA-C 05/26/16 1539    Jeanmarie Plant, MD 05/27/16 [phone]

## 2016-05-26 NOTE — ED Triage Notes (Addendum)
Pt here with right hand pain after punching mirror last night. Was [facility] for lacerations to bilateral hands, reports pain to 4th and 5th knuckles on right hand. Pt reports hx of emotional dysregulation disorder, denies SI or HI.

## 2016-05-26 NOTE — ED Notes (Signed)
Pt arriving to room from xray.

## 2016-06-05 ENCOUNTER — Ambulatory Visit: Payer: PRIVATE HEALTH INSURANCE | Admitting: Psychology

## 2016-06-22 ENCOUNTER — Ambulatory Visit: Payer: PRIVATE HEALTH INSURANCE | Admitting: Psychology

## 2016-07-06 ENCOUNTER — Ambulatory Visit (INDEPENDENT_AMBULATORY_CARE_PROVIDER_SITE_OTHER): Payer: PRIVATE HEALTH INSURANCE | Admitting: Psychology

## 2016-07-06 DIAGNOSIS — F411 Generalized anxiety disorder: Secondary | ICD-10-CM | POA: Diagnosis not present

## 2016-08-17 ENCOUNTER — Ambulatory Visit: Payer: PRIVATE HEALTH INSURANCE | Admitting: Psychology

## 2016-09-14 ENCOUNTER — Ambulatory Visit (INDEPENDENT_AMBULATORY_CARE_PROVIDER_SITE_OTHER): Payer: PRIVATE HEALTH INSURANCE | Admitting: Psychology

## 2016-09-14 DIAGNOSIS — F4323 Adjustment disorder with mixed anxiety and depressed mood: Secondary | ICD-10-CM

## 2016-09-28 ENCOUNTER — Ambulatory Visit (INDEPENDENT_AMBULATORY_CARE_PROVIDER_SITE_OTHER): Payer: PRIVATE HEALTH INSURANCE | Admitting: Psychology

## 2016-09-28 DIAGNOSIS — F4323 Adjustment disorder with mixed anxiety and depressed mood: Secondary | ICD-10-CM

## 2016-10-10 DIAGNOSIS — F4323 Adjustment disorder with mixed anxiety and depressed mood: Secondary | ICD-10-CM | POA: Diagnosis not present

## 2016-10-19 ENCOUNTER — Ambulatory Visit: Payer: Self-pay | Admitting: Psychology

## 2016-12-24 ENCOUNTER — Ambulatory Visit: Payer: PRIVATE HEALTH INSURANCE | Admitting: Psychology

## 2017-01-09 ENCOUNTER — Ambulatory Visit: Payer: PRIVATE HEALTH INSURANCE | Admitting: Psychology

## 2017-02-14 ENCOUNTER — Emergency Department
Admission: EM | Admit: 2017-02-14 | Discharge: 2017-02-14 | Disposition: A | Discharge status: Home / Self Care | Payer: PRIVATE HEALTH INSURANCE | Attending: Emergency Medicine | Admitting: Emergency Medicine

## 2017-02-14 ENCOUNTER — Encounter: Payer: Self-pay | Admitting: Emergency Medicine

## 2017-02-14 ENCOUNTER — Other Ambulatory Visit: Payer: Self-pay

## 2017-02-14 DIAGNOSIS — M5441 Lumbago with sciatica, right side: Secondary | ICD-10-CM | POA: Diagnosis not present

## 2017-02-14 DIAGNOSIS — F17210 Nicotine dependence, cigarettes, uncomplicated: Secondary | ICD-10-CM | POA: Insufficient documentation

## 2017-02-14 DIAGNOSIS — Z79899 Other long term (current) drug therapy: Secondary | ICD-10-CM | POA: Diagnosis not present

## 2017-02-14 DIAGNOSIS — M545 Low back pain: Secondary | ICD-10-CM

## 2017-02-14 MED ORDER — CYCLOBENZAPRINE HCL 5 MG PO TABS: 5.0000 mg | ORAL_TABLET | Freq: Three times a day (TID) | ORAL | 0 refills | Status: DC | PRN

## 2017-02-14 MED ORDER — CYCLOBENZAPRINE HCL 10 MG PO TABS
5.0000 mg | ORAL_TABLET | Freq: Once | ORAL | Status: AC
  Administered 2017-02-14: 5 mg via ORAL
  Filled 2017-02-14: qty 1

## 2017-02-14 MED ORDER — IBUPROFEN 800 MG PO TABS: 800.0000 mg | ORAL_TABLET | Freq: Three times a day (TID) | ORAL | 0 refills | Status: DC | PRN

## 2017-02-14 MED ORDER — KETOROLAC TROMETHAMINE 60 MG/2ML IM SOLN
60.0000 mg | Freq: Once | INTRAMUSCULAR | Status: AC
  Administered 2017-02-14: 60 mg via INTRAMUSCULAR
  Filled 2017-02-14: qty 2

## 2017-02-14 NOTE — ED Notes (Signed)
ED Provider at bedside. 

## 2017-02-14 NOTE — ED Triage Notes (Addendum)
Pt presents to ED with lower back pain. Pt states yesterday around 1730 he coughed and "felt something" in his back and it has been painful since then. Pt reports that "it is really tough to walk". Ambulating slowly with steady gait.  Pt states he wet the bed approx 3 hours ago. Pt states that is not unusual for him.

## 2017-02-14 NOTE — Discharge Instructions (Signed)
Please seek medical attention for any high fevers, chest pain, shortness of breath, change in behavior, persistent vomiting, bloody stool or any other new or concerning symptoms.  

## 2017-02-14 NOTE — ED Notes (Addendum)
Pt states" 2 days ago, aching back, drinking lots of water and it went away, but yesterday when I coughed this spasmming happened and hasn't went away, I can barely bend over and it is excruciating to walk " pt denies known injury. Sensation intact. Pt states unable to control bladder. Pt states "this has happened 2 other times this semester when I was drunk, but today I was stone-cold sober so I don't know what's up with that"  Pt states taking 600 mg ibuprofen at 7pm, 1130pm last night-denies relief. edu provided on proper dosing.

## 2017-02-14 NOTE — ED Notes (Signed)
Pt alert and oriented X4, active, cooperative, pt in NAD. RR even and unlabored, color WNL.  Pt informed to return if any life threatening symptoms occur.  Discharge and followup instructions reviewed.  

## 2017-02-14 NOTE — ED Provider Notes (Signed)
[facility] Emergency Department Provider Note  ____________________________________________   I have reviewed the triage vital signs and the nursing notes.   HISTORY  Chief Complaint Back Pain   History limited by: Not Limited   HPI Cory Daltonvan M Joseph is a 21 y.o. male who presents to the emergency department today because of back pain.   LOCATION:right lower back DURATION:1 day TIMING: started suddenly after he sneezed SEVERITY: severe QUALITY: sharp CONTEXT: patient states that he sneezed and started having the pain. It has been constant. It is severe and affects his gait. He denies any unusual lifting or activity. He denies any IV drug use. Denies any bleeding disorders. Denies similar pain in the past. MODIFYING FACTORS: worse with walking ASSOCIATED SYMPTOMS: patient states he was incontinent of urine last night, but has had that happen a couple of times in the past week  Per medical record review patient has a history of shin pain.  Past Medical History:  Diagnosis Date  . Psychiatric disorder    emotional dysregulation disorder    Patient Active Problem List   Diagnosis Date Noted  . Pain in shin 03/18/2012  . Gait abnormality 03/18/2012    Past Surgical History:  Procedure Laterality Date  . TONSILLECTOMY      Prior to Admission medications   Medication Sig Start Date End Date Taking? Authorizing Provider  cefPROZIL (CEFZIL) 250 MG/5ML suspension  11/09/14   [provider]  CONCERTA 54 MG CR tablet Take 54 mg by mouth every morning. 11/16/14   [provider]  methylphenidate (RITALIN) 5 MG tablet take 1 to 2 tablets by mouth AFTER SCHOOL 11/16/14   [provider]  montelukast (SINGULAIR) 10 MG tablet Take 10 mg by mouth every morning. 11/08/14   [provider]  traMADol Janean Sark(ULTRAM) 50 MG tablet take 1 tablet by mouth every 4 to 6 hours if needed for pain 11/10/14   [provider]     Allergies Hydrocodeine [dihydrocodeine]  No family history on file.  Social History Social History   Tobacco Use  . Smoking status: Current Every Day Smoker    Types: Cigarettes  . Smokeless tobacco: Never Used  Substance Use Topics  . Alcohol use: Yes  . Drug use: Yes    Types: Marijuana    Review of Systems Constitutional: No fever/chills Eyes: No visual changes. ENT: No sore throat. Cardiovascular: Denies chest pain. Respiratory: Denies shortness of breath. Gastrointestinal: No abdominal pain.  No nausea, no vomiting.  No diarrhea.   Genitourinary: Negative for dysuria. Musculoskeletal: Positive for right lower back pain. Skin: Negative for rash. Neurological: Negative for headaches, focal weakness or numbness.  ____________________________________________   PHYSICAL EXAM:  VITAL SIGNS: ED Triage Vitals  Enc Vitals Group     BP 02/14/17 0647 (!) 147/93     Pulse Rate 02/14/17 0647 66     Resp 02/14/17 0647 18     Temp 02/14/17 0647 (!) 97.5 F (36.4 C)     Temp Source 02/14/17 0647 Oral     SpO2 02/14/17 0647 99 %     Weight 02/14/17 0648 170 lb (77.1 kg)     Height 02/14/17 0648 5\' 9"  (1.753 m)     Head Circumference --      Peak Flow --      Pain Score 02/14/17 0648 7   Constitutional: Alert and oriented. Well appearing and in no distress. Eyes: Conjunctivae are normal.  ENT   Head: Normocephalic and atraumatic.  Nose: No congestion/rhinnorhea.   Mouth/Throat: Mucous membranes are moist.   Neck: No stridor. Hematological/Lymphatic/Immunilogical: No cervical lymphadenopathy. Cardiovascular: Normal rate, regular rhythm.  No murmurs, rubs, or gallops. Respiratory: Normal respiratory effort without tachypnea nor retractions. Breath sounds are clear and equal bilaterally. No wheezes/rales/rhonchi. Gastrointestinal: Soft and non tender. No rebound. No guarding.  Genitourinary: Deferred Musculoskeletal: Normal range of motion in all  extremities. No midline spinal tenderness. Some tenderness over right SI joint.  Neurologic:  Normal speech and language. Strength 5/5 in lower extremities. No gross focal neurologic deficits are appreciated.  Skin:  Skin is warm, dry and intact. No rash noted. Psychiatric: Mood and affect are normal. Speech and behavior are normal. Patient exhibits appropriate insight and judgment.  ____________________________________________    LABS (pertinent positives/negatives)  None  ____________________________________________   EKG  None  ____________________________________________    RADIOLOGY  None  ____________________________________________   PROCEDURES  Procedures  ____________________________________________   INITIAL IMPRESSION / ASSESSMENT AND PLAN / ED COURSE  Pertinent labs & imaging results that were available during my care of the patient were reviewed by me and considered in my medical decision making (see chart for details).  Patient presents with right sided low back pain. Not midline. No IV drug use, no bleeding disorders. No urinary retention or bowel incontinence. Did have an episode of enuresis last night of unclear significance, he states he has had this happen a couple of times in the past few months. However given lack of other concerning findings do not think patient is suffering from central cord compression or cauda equina.  He did feel significantly better after medication and was able to ambulate without difficulty. Did discuss return precautions with the patient.    ____________________________________________   FINAL CLINICAL IMPRESSION(S) / ED DIAGNOSES  Final diagnoses:  Acute right-sided low back pain, with sciatica presence unspecified     Note: This dictation was prepared with Dragon dictation. Any transcriptional errors that result from this process are unintentional     Phineas SemenGoodman, Corde Antonini, MD 02/14/17 [phone]

## 2017-04-28 ENCOUNTER — Encounter: Payer: Self-pay | Admitting: Emergency Medicine

## 2017-04-28 ENCOUNTER — Other Ambulatory Visit: Payer: Self-pay

## 2017-04-28 DIAGNOSIS — Y9389 Activity, other specified: Secondary | ICD-10-CM | POA: Diagnosis not present

## 2017-04-28 DIAGNOSIS — W010XXA Fall on same level from slipping, tripping and stumbling without subsequent striking against object, initial encounter: Secondary | ICD-10-CM | POA: Insufficient documentation

## 2017-04-28 DIAGNOSIS — S0181XA Laceration without foreign body of other part of head, initial encounter: Secondary | ICD-10-CM | POA: Diagnosis not present

## 2017-04-28 DIAGNOSIS — Y929 Unspecified place or not applicable: Secondary | ICD-10-CM | POA: Insufficient documentation

## 2017-04-28 DIAGNOSIS — Z23 Encounter for immunization: Secondary | ICD-10-CM | POA: Insufficient documentation

## 2017-04-28 DIAGNOSIS — Y999 Unspecified external cause status: Secondary | ICD-10-CM | POA: Insufficient documentation

## 2017-04-28 DIAGNOSIS — F17210 Nicotine dependence, cigarettes, uncomplicated: Secondary | ICD-10-CM | POA: Diagnosis not present

## 2017-04-28 DIAGNOSIS — S0993XA Unspecified injury of face, initial encounter: Secondary | ICD-10-CM | POA: Diagnosis present

## 2017-04-28 NOTE — ED Triage Notes (Signed)
Pt admits to being intoxicated and fell; laceration to chin; no loss of consciousness; bleeding controlled at this time; reports jaw pain but able to open mouth completely; talking in complete coherent sentences; pt understands to be placed in a wheelchair for safety

## 2017-04-29 ENCOUNTER — Emergency Department
Admission: EM | Admit: 2017-04-29 | Discharge: 2017-04-29 | Disposition: A | Discharge status: Home / Self Care | Payer: PRIVATE HEALTH INSURANCE | Attending: Emergency Medicine | Admitting: Emergency Medicine

## 2017-04-29 ENCOUNTER — Other Ambulatory Visit: Payer: Self-pay

## 2017-04-29 DIAGNOSIS — W19XXXA Unspecified fall, initial encounter: Secondary | ICD-10-CM

## 2017-04-29 DIAGNOSIS — S0181XA Laceration without foreign body of other part of head, initial encounter: Secondary | ICD-10-CM

## 2017-04-29 HISTORY — DX: Attention-deficit hyperactivity disorder, unspecified type: F90.9

## 2017-04-29 MED ORDER — IBUPROFEN 800 MG PO TABS
800.0000 mg | ORAL_TABLET | Freq: Once | ORAL | Status: AC
  Administered 2017-04-29: 800 mg via ORAL
  Filled 2017-04-29: qty 1

## 2017-04-29 MED ORDER — TETANUS-DIPHTH-ACELL PERTUSSIS 5-2.5-18.5 LF-MCG/0.5 IM SUSP
0.5000 mL | Freq: Once | INTRAMUSCULAR | Status: AC
  Administered 2017-04-29: 0.5 mL via INTRAMUSCULAR
  Filled 2017-04-29: qty 0.5

## 2017-04-29 MED ORDER — BACITRACIN ZINC 500 UNIT/GM EX OINT
TOPICAL_OINTMENT | Freq: Once | CUTANEOUS | Status: AC
Start: 2017-04-29 — End: 2017-04-29
  Administered 2017-04-29: 1 via TOPICAL
  Filled 2017-04-29: qty 0.9

## 2017-04-29 MED ORDER — LIDOCAINE HCL (PF) 1 % IJ SOLN
INTRAMUSCULAR | Status: AC
  Filled 2017-04-29: qty 5

## 2017-04-29 NOTE — ED Notes (Signed)
Pt and girlfriend updated on wait.

## 2017-04-29 NOTE — Discharge Instructions (Addendum)
1.  Suture removal in 5 days. 2.  Your tetanus has been updated and will be good for 10 years. 3.  Return to the ER for worsening symptoms, persistent vomiting, lethargy, purulent discharge from wound, or other concerns.

## 2017-04-29 NOTE — ED Notes (Signed)
Pt reports "getting drunk and busting my face" - denies LOC  Pt with laceration to mental process, bleeding controlled, reports no meds taken, no allergies, last Tetanus 2012

## 2017-04-29 NOTE — ED Provider Notes (Signed)
[facility] Emergency Department Provider Note   ____________________________________________   First MD Initiated Contact with Patient 04/29/17 0404     (approximate)  I have reviewed the triage vital signs and the nursing notes.   HISTORY  Chief Complaint Fall and Facial Laceration    HPI Cory Joseph is a 22 y.o. male who presents to the ED from home with a chief complaint of chin laceration.  Patient admits to intoxicated and fell, striking his chin.  Denies LOC.  Complains of sore jaw without difficulty opening or speaking.  Denies neck pain, chest pain, shortness of breath, abdominal pain, nausea, vomiting, dizziness.  Tetanus is not up-to-date.   Past Medical History:  Diagnosis Date  . ADHD   . Psychiatric disorder    emotional dysregulation disorder    Patient Active Problem List   Diagnosis Date Noted  . Pain in shin 03/18/2012  . Gait abnormality 03/18/2012    Past Surgical History:  Procedure Laterality Date  . TONSILLECTOMY      Prior to Admission medications   Not on File    Allergies Hydrocodeine [dihydrocodeine] and Hydrocodone  History reviewed. No pertinent family history.  Social History Social History   Tobacco Use  . Smoking status: Current Every Day Smoker    Packs/day: 0.50    Types: Cigarettes  . Smokeless tobacco: Never Used  Substance Use Topics  . Alcohol use: Yes  . Drug use: Yes    Types: Marijuana    Comment: last smoked 04/27/17    Review of Systems   Constitutional: No fever/chills. Eyes: No visual changes. ENT: Positive for chin laceration.  No sore throat. Cardiovascular: Denies chest pain. Respiratory: Denies shortness of breath. Gastrointestinal: No abdominal pain.  No nausea, no vomiting.  No diarrhea.  No constipation. Genitourinary: Negative for dysuria. Musculoskeletal: Negative for back pain. Skin: Negative for rash. Neurological: Negative for headaches, focal weakness or  numbness.   ____________________________________________   PHYSICAL EXAM:  VITAL SIGNS: ED Triage Vitals  Enc Vitals Group     BP 04/29/17 0315 130/64     Pulse Rate 04/29/17 0315 61     Resp 04/29/17 0315 17     Temp --      Temp src --      SpO2 04/29/17 0315 99 %     Weight 04/28/17 2328 180 lb (81.6 kg)     Height 04/28/17 2328 5\' 9"  (1.753 m)     Head Circumference --      Peak Flow --      Pain Score 04/28/17 2328 4     Pain Loc --      Pain Edu? --      Excl. in GC? --     Constitutional: Alert and oriented. Well appearing and in no acute distress. Eyes: Conjunctivae are normal. PERRL. EOMI. Head: Atraumatic. Nose: No deformity. Mouth/Throat: No dental malocclusion.  Able to open jaw widely without difficulty.  Approximately 3 cm curved and macerated laceration with surrounding abrasion to the tip of his chin.  Patient has a beard.  Abrasion has scraped off part of the beard surrounding the laceration.   Neck: No stridor.  No cervical spine tenderness to palpation.  No step-offs or deformities noted. Cardiovascular: Normal rate, regular rhythm. Grossly normal heart sounds.  Good peripheral circulation. Respiratory: Normal respiratory effort.  No retractions. Lungs CTAB. Gastrointestinal: Soft and nontender. No distention. No abdominal bruits. No CVA tenderness. Musculoskeletal: No lower extremity tenderness nor  edema.  No joint effusions. Neurologic: Alert and oriented x3.  CN II-XII grossly intact.  Normal speech and language. No gross focal neurologic deficits are appreciated. MAEx4. No gait instability. Skin:  Skin is warm, dry and intact. No rash noted. Psychiatric: Mood and affect are normal. Speech and behavior are normal.  ____________________________________________   LABS (all labs ordered are listed, but only abnormal results are displayed)  Labs Reviewed - No data to  display ____________________________________________  EKG  None ____________________________________________  RADIOLOGY  ED MD interpretation: None  Official radiology report(s): No results found.  ____________________________________________   PROCEDURES  Procedure(s) performed:   Marland Kitchen.Marland Kitchen.Laceration Repair Date/Time: 04/29/2017 4:37 AM Performed by: Irean HongSung, Roston Grunewald J, MD Authorized by: Irean HongSung, Hilman Kissling J, MD   Consent:    Consent obtained:  Verbal   Consent given by:  Patient   Risks discussed:  Infection, pain, poor cosmetic result and poor wound healing Anesthesia (see MAR for exact dosages):    Anesthesia method:  Local infiltration   Local anesthetic:  Lidocaine 1% w/o epi Laceration details:    Location:  Face   Face location:  Chin   Length (cm):  3 Repair type:    Repair type:  Simple Pre-procedure details:    Preparation:  Patient was prepped and draped in usual sterile fashion Exploration:    Hemostasis achieved with:  Direct pressure   Wound exploration: entire depth of wound probed and visualized     Contaminated: no   Treatment:    Area cleansed with:  Saline   Amount of cleaning:  Standard   Irrigation solution:  Sterile saline   Irrigation method:  Pressure wash   Visualized foreign bodies/material removed: no   Skin repair:    Repair method:  Sutures   Suture size:  6-0   Suture technique:  Running Approximation:    Approximation:  Close Post-procedure details:    Dressing:  Antibiotic ointment   Patient tolerance of procedure:  Tolerated well, no immediate complications     Critical Care performed: No  ____________________________________________   INITIAL IMPRESSION / ASSESSMENT AND PLAN / ED COURSE  As part of my medical decision making, I reviewed the following data within the electronic MEDICAL RECORD NUMBER Nursing notes reviewed and incorporated and Notes from prior ED visits.   22 year old male who presents with chin laceration status post  mechanical fall while intoxicated.  Injury occurred around 11 PM. Patient does not have focal neurological deficits, ambulates with steady gait.  Feels CT imaging is not warranted at this time.  Patient tolerated sutures well.  Strict return precautions given.  Patient verbalizes understanding and agrees with plan of care.      ____________________________________________   FINAL CLINICAL IMPRESSION(S) / ED DIAGNOSES  Final diagnoses:  Chin laceration, initial encounter  Fall, initial encounter     ED Discharge Orders    None       Note:  This document was prepared using Dragon voice recognition software and may include unintentional dictation errors.    Irean HongSung, Mirel Hundal J, MD 04/29/17 [phone]

## 2017-04-29 NOTE — ED Notes (Signed)
Pt sitting in lobby in triage waiting area with significant other. Pt in no acute distress.

## 2017-06-24 ENCOUNTER — Ambulatory Visit (INDEPENDENT_AMBULATORY_CARE_PROVIDER_SITE_OTHER): Payer: PRIVATE HEALTH INSURANCE | Admitting: Psychology

## 2017-06-24 DIAGNOSIS — F4323 Adjustment disorder with mixed anxiety and depressed mood: Secondary | ICD-10-CM | POA: Diagnosis not present

## 2017-06-25 ENCOUNTER — Ambulatory Visit: Payer: Self-pay | Admitting: Psychology

## 2017-07-18 ENCOUNTER — Ambulatory Visit: Payer: PRIVATE HEALTH INSURANCE | Admitting: Psychology

## 2017-10-09 IMAGING — CT CT HEAD W/O CM
1 series · 16 of 30 positions shown, 20 images · non-contrast
Comparison: None.

CLINICAL DATA: Generalized headache after being hit in bridge of
nose with soccer ball. Initial encounter.

EXAM:
CT HEAD WITHOUT CONTRAST
TECHNIQUE: Contiguous axial images were obtained from the base of the skull
through the vertex without intravenous contrast.

[Series 2: head wo · axial · 0.43mm/px · z∈[-162,-23]mm · 16 of 35 slices shown, 20 images]
[im 2/35  brain]
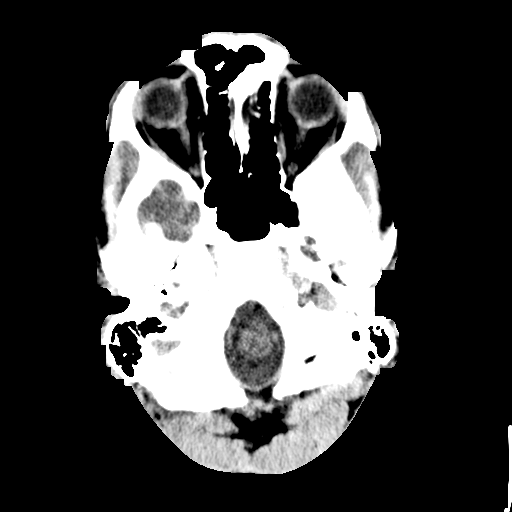
[im 2/35  bone]
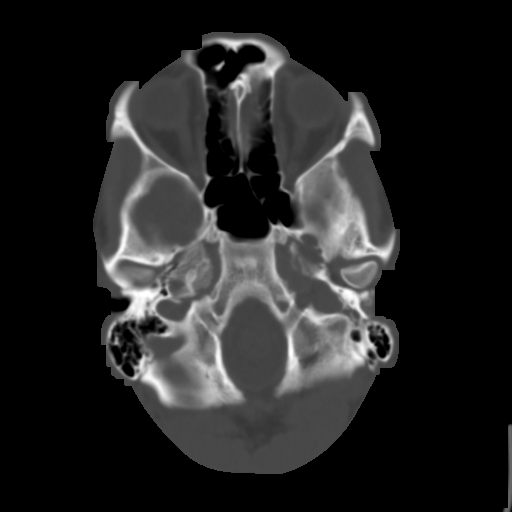
[im 4/35  brain]
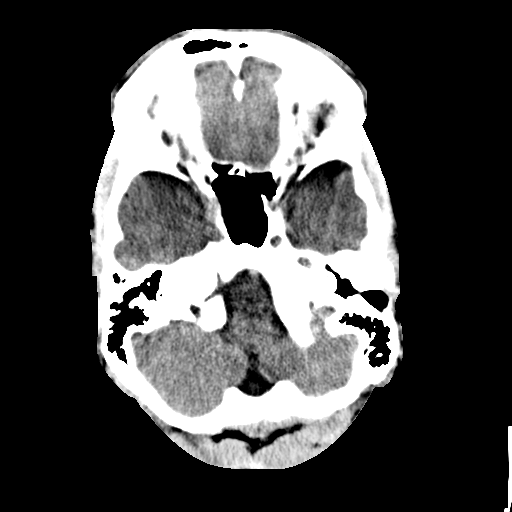
[im 6/35  brain]
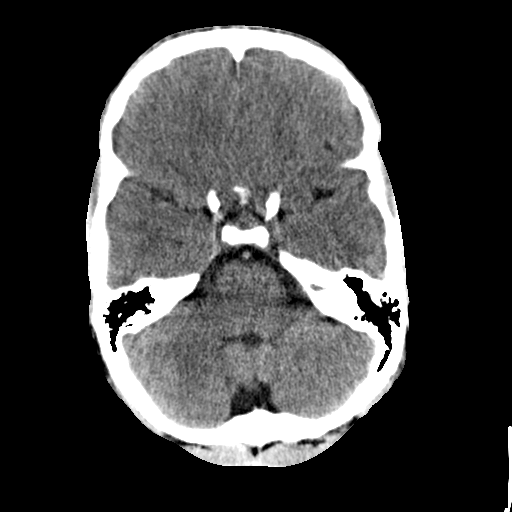
[im 9/35  brain]
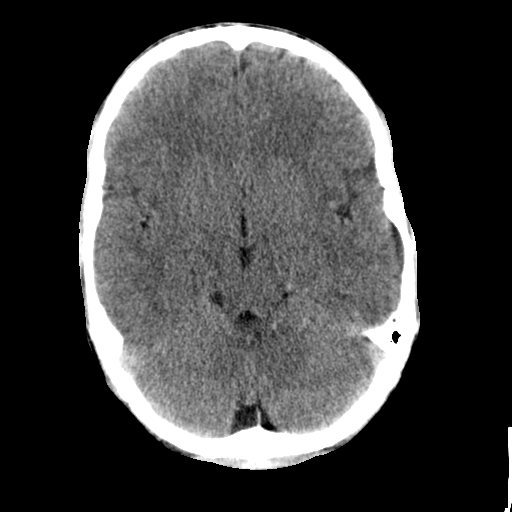
[im 10/35  brain]
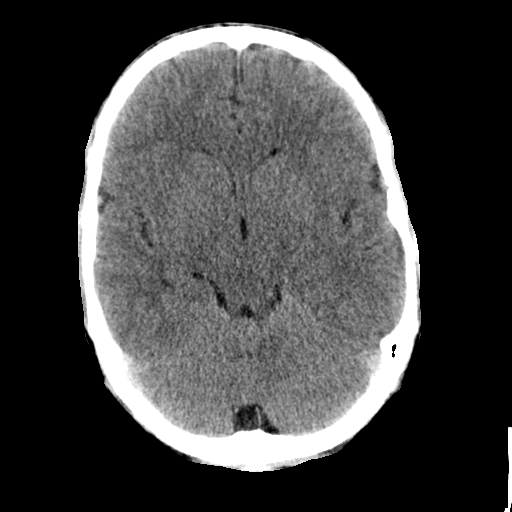
[im 10/35  bone]
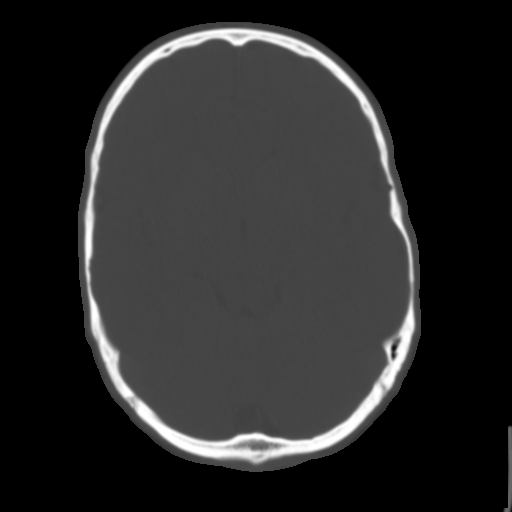
[im 12/35  brain]
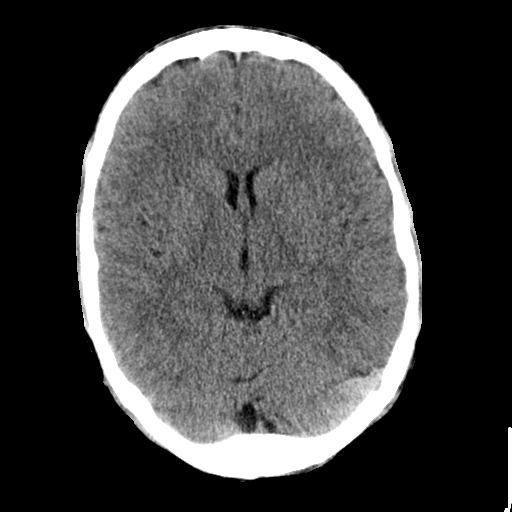
[im 15/35  brain]
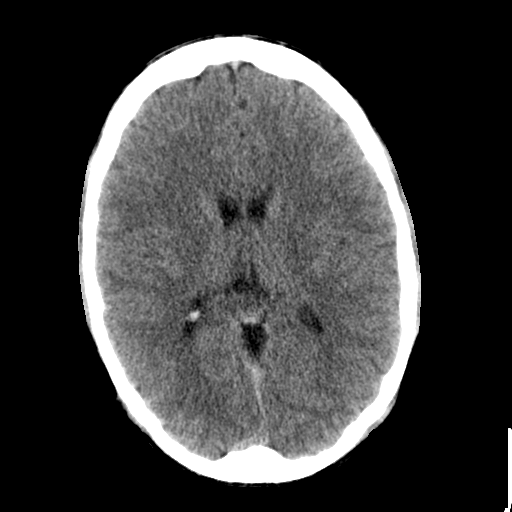
[im 17/35  brain]
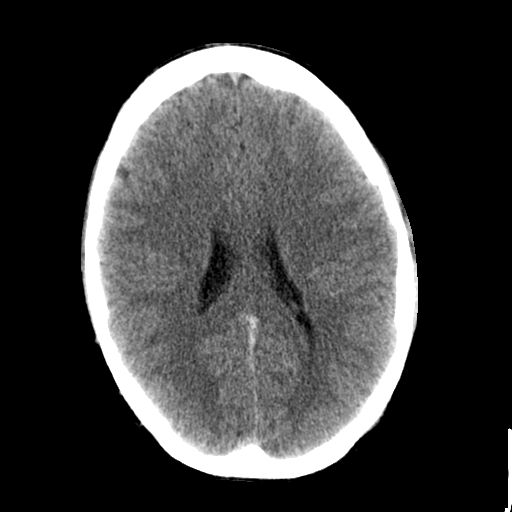
[im 18/35  brain]
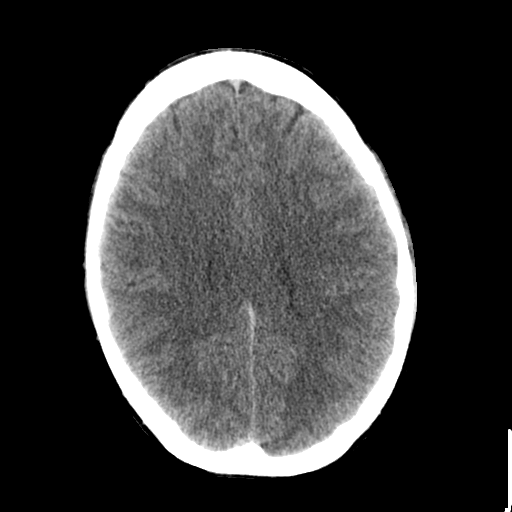
[im 18/35  bone]
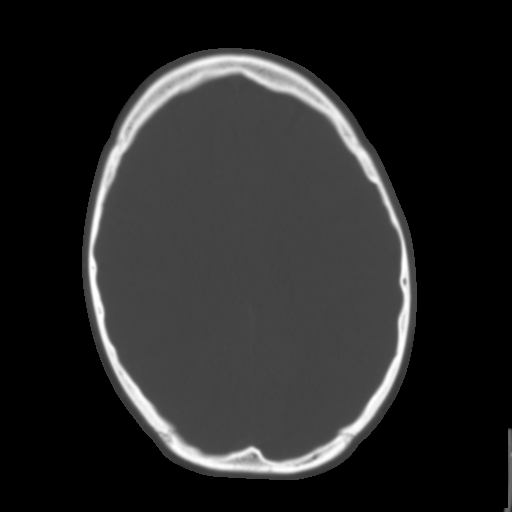
[im 20/35  brain]
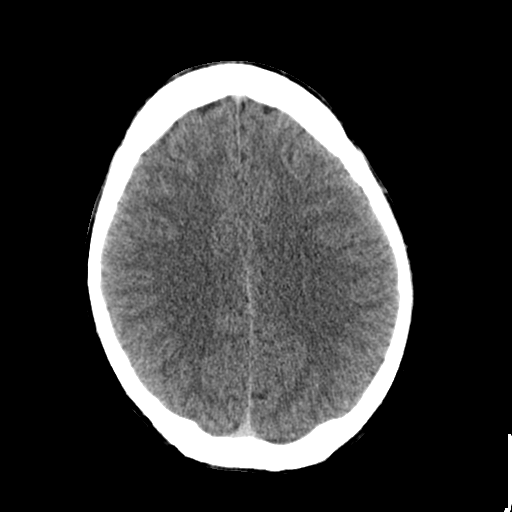
[im 23/35  brain]
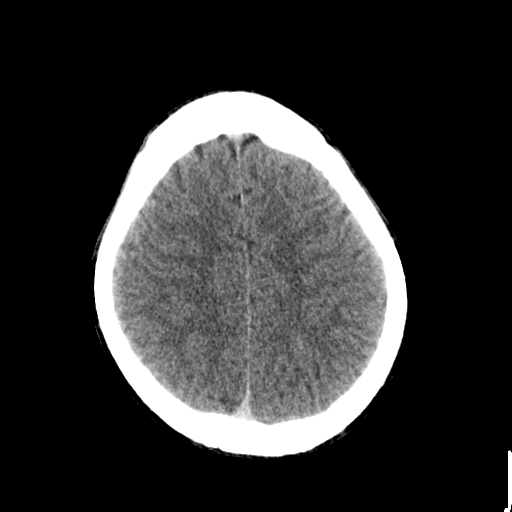
[im 25/35  brain]
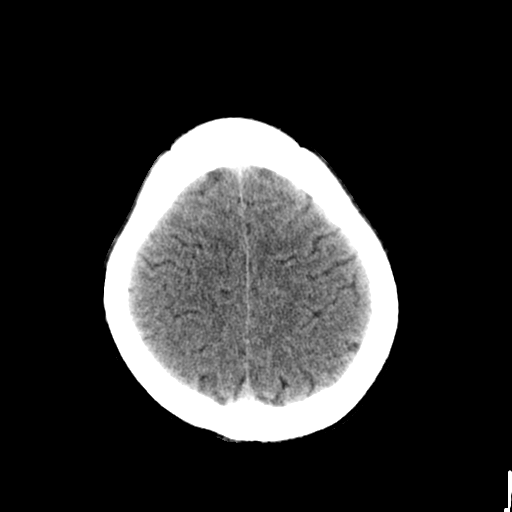
[im 26/35  brain]
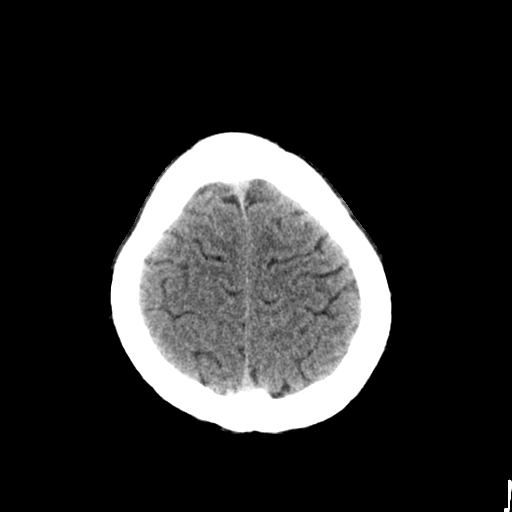
[im 26/35  bone]
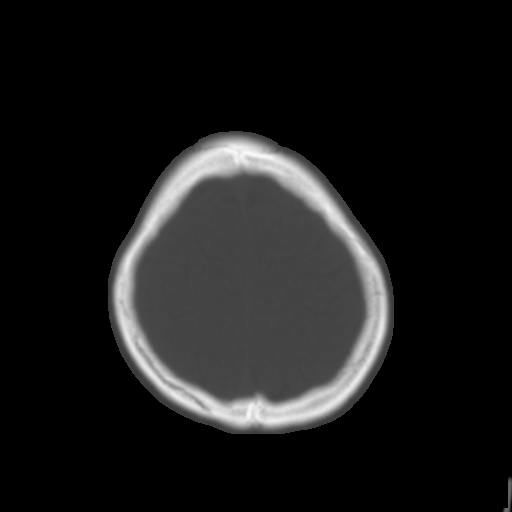
[im 29/35  brain]
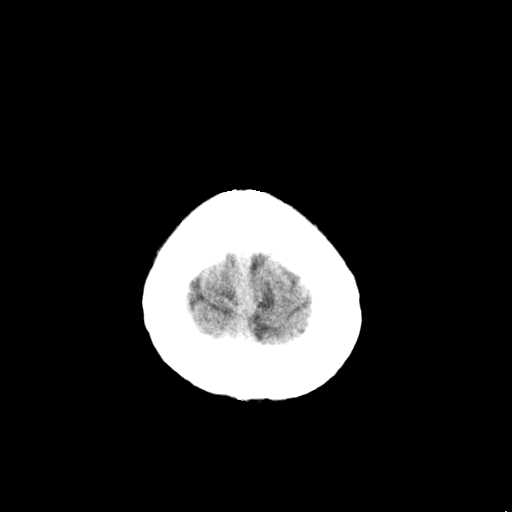
[im 31/35  brain]
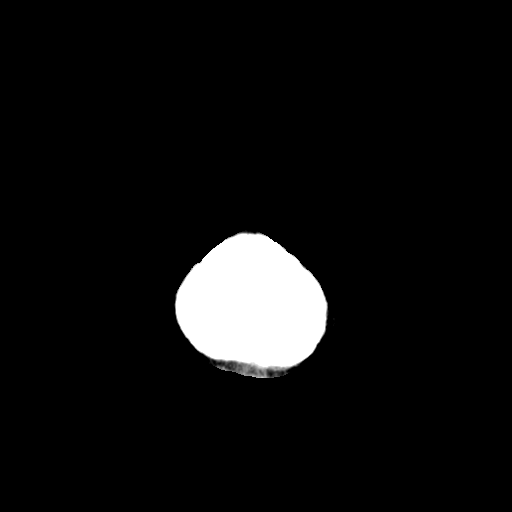
[im 33/35  brain]
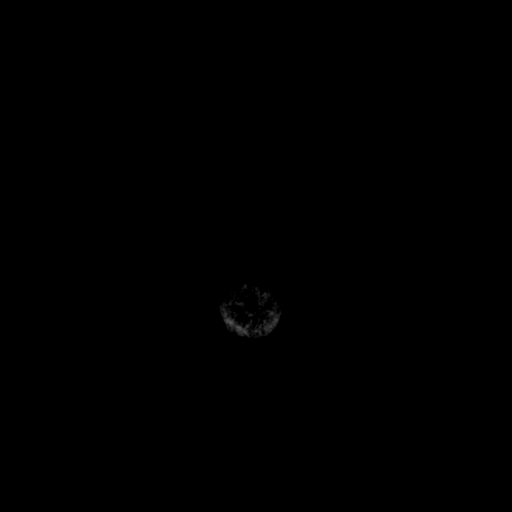

[16 of 30 positions shown; findings below may reference images not displayed]

FINDINGS: There is no evidence of acute infarction, mass lesion, or intra- or
extra-axial hemorrhage on CT.

The posterior fossa, including the cerebellum, brainstem and fourth
ventricle, is within normal limits. The third and lateral
ventricles, and basal ganglia are unremarkable in appearance. The
cerebral hemispheres are symmetric in appearance, with normal
gray-white differentiation. No mass effect or midline shift is seen.

There is no evidence of fracture; visualized osseous structures are
unremarkable in appearance. The visualized portions of the orbits
are within normal limits. The paranasal sinuses and mastoid air
cells are well-aerated. No significant soft tissue abnormalities are
seen.
IMPRESSION: No evidence of traumatic intracranial injury or fracture.

## 2018-05-12 ENCOUNTER — Emergency Department
Admission: EM | Admit: 2018-05-12 | Discharge: 2018-05-12 | Disposition: A | Discharge status: Home / Self Care | Payer: No Typology Code available for payment source | Attending: Emergency Medicine | Admitting: Emergency Medicine

## 2018-05-12 DIAGNOSIS — F1210 Cannabis abuse, uncomplicated: Secondary | ICD-10-CM | POA: Diagnosis not present

## 2018-05-12 DIAGNOSIS — Y990 Civilian activity done for income or pay: Secondary | ICD-10-CM | POA: Diagnosis not present

## 2018-05-12 DIAGNOSIS — Y9289 Other specified places as the place of occurrence of the external cause: Secondary | ICD-10-CM | POA: Diagnosis not present

## 2018-05-12 DIAGNOSIS — F17210 Nicotine dependence, cigarettes, uncomplicated: Secondary | ICD-10-CM | POA: Insufficient documentation

## 2018-05-12 DIAGNOSIS — W260XXA Contact with knife, initial encounter: Secondary | ICD-10-CM | POA: Diagnosis not present

## 2018-05-12 DIAGNOSIS — S6992XA Unspecified injury of left wrist, hand and finger(s), initial encounter: Secondary | ICD-10-CM | POA: Diagnosis present

## 2018-05-12 DIAGNOSIS — S61211A Laceration without foreign body of left index finger without damage to nail, initial encounter: Secondary | ICD-10-CM | POA: Diagnosis not present

## 2018-05-12 DIAGNOSIS — Y93G1 Activity, food preparation and clean up: Secondary | ICD-10-CM | POA: Insufficient documentation

## 2018-05-12 MED ORDER — CEPHALEXIN 500 MG PO CAPS: 500.0000 mg | ORAL_CAPSULE | Freq: Three times a day (TID) | ORAL | 0 refills | Status: AC

## 2018-05-12 NOTE — ED Notes (Signed)
See triage note  Presents with laceration noted to left index finger  .

## 2018-05-12 NOTE — Discharge Instructions (Addendum)
Please follow up with your primary care provider for symptoms of concern.   Do not get your finger wet for at least the next 2 days.  Return to the ER for symptoms of concern if unable to schedule an appointment.

## 2018-05-12 NOTE — ED Triage Notes (Signed)
Left hand 2nd digit finger laceration with knife at work today. Pt wants to file workers comp. Bleeding controlled. Pt alert and oriented X4, active, cooperative, pt in NAD. RR even and unlabored, color WNL.

## 2018-05-12 NOTE — ED Notes (Signed)
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

## 2018-05-12 NOTE — ED Provider Notes (Signed)
[facility] Emergency Department Provider Note  ____________________________________________  Time seen: Approximately 4:26 PM  I have reviewed the triage vital signs and the nursing notes.   HISTORY  Chief Complaint Laceration   HPI Cory Joseph is a 23 y.o. male who presents to the ER for laceration repair. 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 Tdap is up to date. Bleeding is well controlled.  Past Medical History:  Diagnosis Date  . ADHD   . Psychiatric disorder    emotional dysregulation disorder    Patient Active Problem List   Diagnosis Date Noted  . Pain in shin 03/18/2012  . Gait abnormality 03/18/2012    Past Surgical History:  Procedure Laterality Date  . TONSILLECTOMY      Prior to Admission medications   Medication Sig Start Date End Date Taking? Authorizing Provider  cephALEXin (KEFLEX) 500 MG capsule Take 1 capsule (500 mg total) by mouth 3 (three) times daily for 10 days. 05/12/18 05/22/18  Chinita Pester, FNP    Allergies Hydrocodeine [dihydrocodeine] and Hydrocodone  No family history on file.  Social History Social History   Tobacco Use  . Smoking status: Current Every Day Smoker    Packs/day: 0.50    Types: Cigarettes  . Smokeless tobacco: Never Used  Substance Use Topics  . Alcohol use: Yes  . Drug use: Yes    Types: Marijuana    Comment: last smoked 04/27/17    Review of Systems  Constitutional: Negative for fever. Respiratory: Negative for cough or shortness of breath.  Musculoskeletal: Negative for myalgias Skin: Positive for laceration to the left index finger. Neurological: Negative for numbness or paresthesias. ____________________________________________   PHYSICAL EXAM:  VITAL SIGNS: ED Triage Vitals [05/12/18 1556]  Enc Vitals Group     BP (!) 144/84     Pulse Rate 74     Resp 18     Temp 98.3 F (36.8 C)     Temp Source Oral     SpO2 100  %     Weight 170 lb (77.1 kg)     Height 5\' 10"  (1.778 m)     Head Circumference      Peak Flow      Pain Score 0     Pain Loc      Pain Edu?      Excl. in GC?      Constitutional: Well appearing. Eyes: Conjunctivae are clear without discharge or drainage. Nose: No rhinorrhea noted. Mouth/Throat: Airway is patent.  Neck: No stridor. Unrestricted range of motion observed. Cardiovascular: Capillary refill is <3 seconds.  Respiratory: Respirations are even and unlabored.. Musculoskeletal: Unrestricted range of motion observed. Neurologic: Awake, alert, and oriented x 4.  Skin: 1.5cm flap laceration to the left index finger.  ____________________________________________   LABS (all labs ordered are listed, but only abnormal results are displayed)  Labs Reviewed - No data to display ____________________________________________  EKG  Not indicated. ____________________________________________  RADIOLOGY  Not indicated. ____________________________________________   PROCEDURES  Procedures ____________________________________________   INITIAL IMPRESSION / ASSESSMENT AND PLAN / ED COURSE  Cory Joseph is a 23 y.o. 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  Flap laceration was irrigated with Hibiclens and normal saline then closed with Dermabond.  Aluminum-foam static finger splint was applied for protection.  Wound care was discussed.  He was  discharged and advised to see primary care if he has concerns.  He was advised to return to the emergency department for symptoms of change or worsen if he is unable to schedule an appointment.   Medications - No data to display   Pertinent labs & imaging results that were available during my care of the patient were reviewed by me and considered in my medical decision making (see chart for details).   ____________________________________________   FINAL CLINICAL IMPRESSION(S) / ED DIAGNOSES  Final diagnoses:  Laceration of left index finger, foreign body presence unspecified, nail damage status unspecified, initial encounter    ED Discharge Orders         Ordered    cephALEXin (KEFLEX) 500 MG capsule  3 times daily     05/12/18 1658           Note:  This document was prepared using Dragon voice recognition software and may include unintentional dictation errors.    Chinita Pester, FNP 05/12/18 1821    Minna Antis, MD 05/12/18 1956

## 2018-06-13 DIAGNOSIS — F900 Attention-deficit hyperactivity disorder, predominantly inattentive type: Secondary | ICD-10-CM | POA: Diagnosis not present

## 2018-11-03 DIAGNOSIS — F900 Attention-deficit hyperactivity disorder, predominantly inattentive type: Secondary | ICD-10-CM | POA: Diagnosis not present

## 2019-01-24 IMAGING — CR DG HAND COMPLETE 3+V*R*
1 series · 3 of 3 positions shown · non-contrast
Comparison: None.

CLINICAL DATA: Punched a mirror yesterday. Multiple right hand
lacerations. Initial encounter.

EXAM:
RIGHT HAND - COMPLETE 3+ VIEW

[Series 1: dg hand complete right · 0.14mm/px · 3 of 3 slices shown]
[im 1/3]
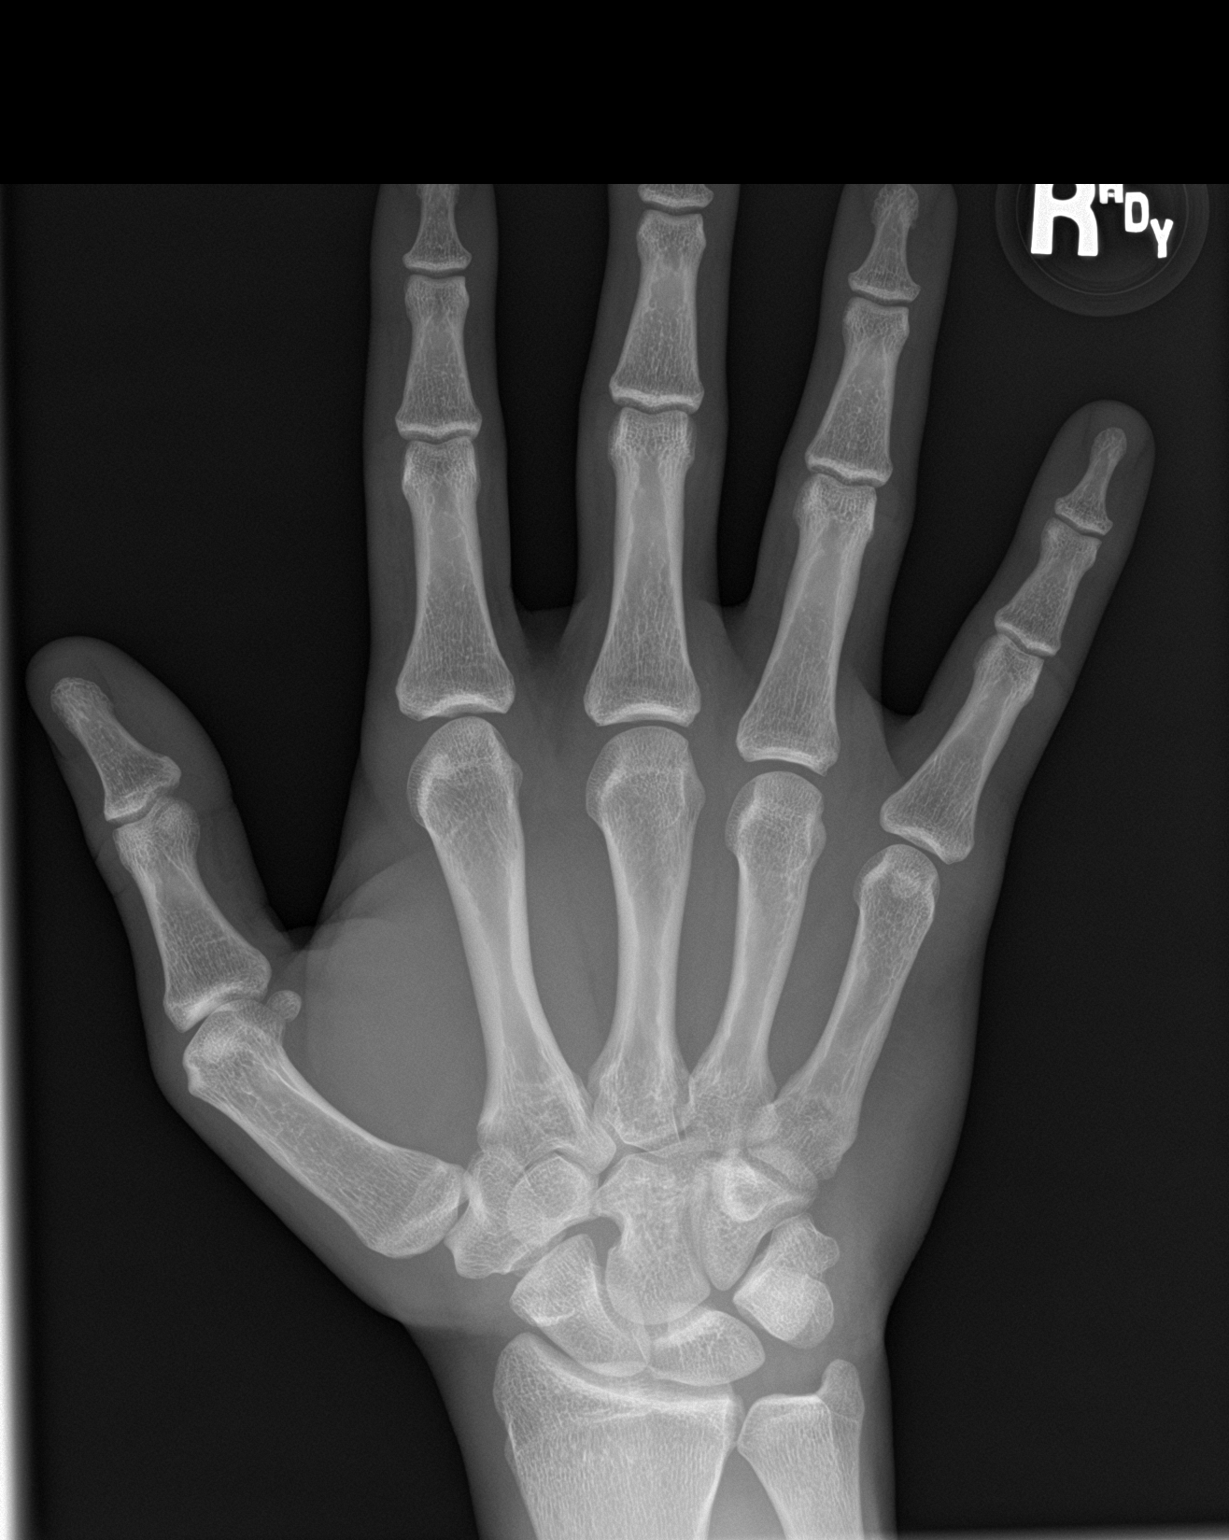
[im 2/3]
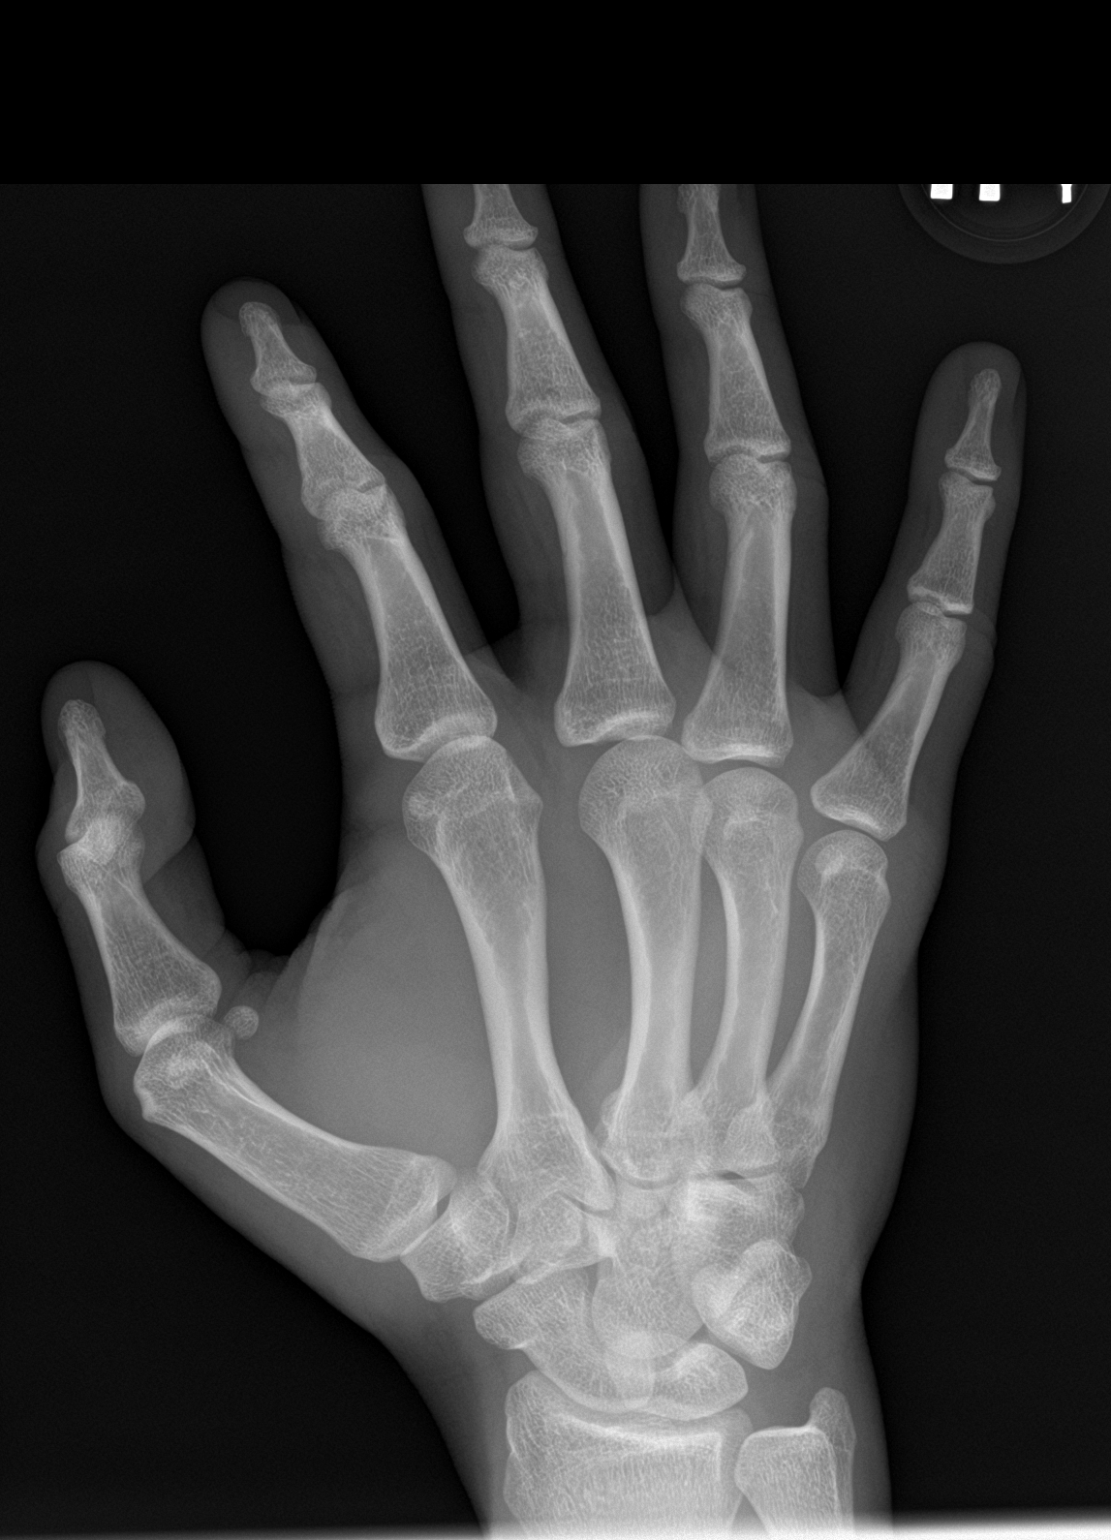
[im 3/3]
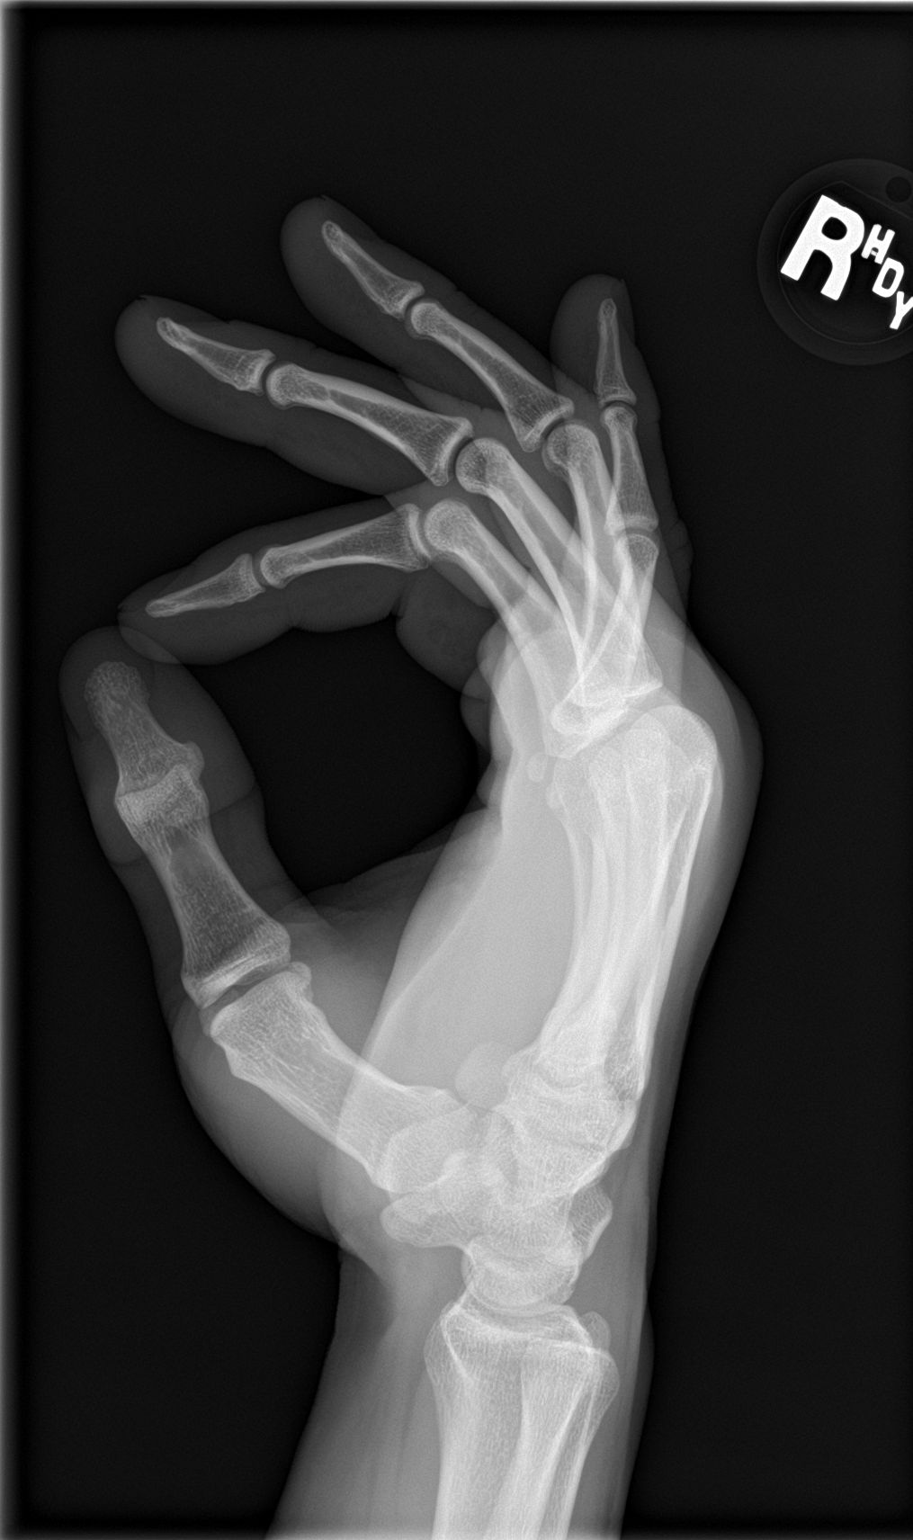

[3 of 3 positions shown; findings below may reference images not displayed]

FINDINGS: There is no evidence of fracture or dislocation. There is no
evidence of arthropathy or other focal bone abnormality. Soft
tissues are unremarkable.
IMPRESSION: Negative for fracture or opaque foreign body.

## 2019-02-09 DIAGNOSIS — Z23 Encounter for immunization: Secondary | ICD-10-CM | POA: Diagnosis not present

## 2019-02-09 DIAGNOSIS — F900 Attention-deficit hyperactivity disorder, predominantly inattentive type: Secondary | ICD-10-CM | POA: Diagnosis not present

## 2019-05-11 DIAGNOSIS — F900 Attention-deficit hyperactivity disorder, predominantly inattentive type: Secondary | ICD-10-CM | POA: Diagnosis not present

## 2019-06-12 DIAGNOSIS — Z03818 Encounter for observation for suspected exposure to other biological agents ruled out: Secondary | ICD-10-CM | POA: Diagnosis not present

## 2019-06-12 DIAGNOSIS — Z20828 Contact with and (suspected) exposure to other viral communicable diseases: Secondary | ICD-10-CM | POA: Diagnosis not present

## 2019-08-10 DIAGNOSIS — F900 Attention-deficit hyperactivity disorder, predominantly inattentive type: Secondary | ICD-10-CM | POA: Diagnosis not present

## 2019-09-05 DIAGNOSIS — H00025 Hordeolum internum left lower eyelid: Secondary | ICD-10-CM | POA: Diagnosis not present

## 2019-11-09 DIAGNOSIS — F900 Attention-deficit hyperactivity disorder, predominantly inattentive type: Secondary | ICD-10-CM | POA: Diagnosis not present

## 2020-02-04 DIAGNOSIS — J029 Acute pharyngitis, unspecified: Secondary | ICD-10-CM | POA: Diagnosis not present

## 2020-04-06 DIAGNOSIS — F900 Attention-deficit hyperactivity disorder, predominantly inattentive type: Secondary | ICD-10-CM | POA: Diagnosis not present

## 2021-05-31 DIAGNOSIS — F900 Attention-deficit hyperactivity disorder, predominantly inattentive type: Secondary | ICD-10-CM | POA: Diagnosis not present

## 2021-11-03 DIAGNOSIS — M25561 Pain in right knee: Secondary | ICD-10-CM | POA: Diagnosis not present

## 2021-11-03 DIAGNOSIS — F900 Attention-deficit hyperactivity disorder, predominantly inattentive type: Secondary | ICD-10-CM | POA: Diagnosis not present

## 2021-11-03 DIAGNOSIS — G8929 Other chronic pain: Secondary | ICD-10-CM | POA: Diagnosis not present

## 2022-02-13 DIAGNOSIS — M25561 Pain in right knee: Secondary | ICD-10-CM | POA: Diagnosis not present

## 2022-02-16 DIAGNOSIS — M25561 Pain in right knee: Secondary | ICD-10-CM | POA: Diagnosis not present

## 2022-05-14 DIAGNOSIS — F902 Attention-deficit hyperactivity disorder, combined type: Secondary | ICD-10-CM | POA: Diagnosis not present

## 2022-05-25 DIAGNOSIS — F902 Attention-deficit hyperactivity disorder, combined type: Secondary | ICD-10-CM | POA: Diagnosis not present

## 2022-06-06 DIAGNOSIS — F902 Attention-deficit hyperactivity disorder, combined type: Secondary | ICD-10-CM | POA: Diagnosis not present

## 2022-07-07 DIAGNOSIS — J029 Acute pharyngitis, unspecified: Secondary | ICD-10-CM | POA: Diagnosis not present

## 2022-07-07 DIAGNOSIS — R519 Headache, unspecified: Secondary | ICD-10-CM | POA: Diagnosis not present

## 2022-09-13 DIAGNOSIS — M25579 Pain in unspecified ankle and joints of unspecified foot: Secondary | ICD-10-CM | POA: Diagnosis not present

## 2022-09-13 DIAGNOSIS — G8929 Other chronic pain: Secondary | ICD-10-CM | POA: Diagnosis not present

## 2022-09-13 DIAGNOSIS — M25561 Pain in right knee: Secondary | ICD-10-CM | POA: Diagnosis not present

## 2022-09-13 DIAGNOSIS — F900 Attention-deficit hyperactivity disorder, predominantly inattentive type: Secondary | ICD-10-CM | POA: Diagnosis not present

## 2022-12-13 DIAGNOSIS — M25561 Pain in right knee: Secondary | ICD-10-CM | POA: Diagnosis not present

## 2023-02-18 DIAGNOSIS — S83281A Other tear of lateral meniscus, current injury, right knee, initial encounter: Secondary | ICD-10-CM | POA: Diagnosis not present

## 2023-02-18 DIAGNOSIS — Y999 Unspecified external cause status: Secondary | ICD-10-CM | POA: Diagnosis not present

## 2023-02-18 DIAGNOSIS — M65961 Unspecified synovitis and tenosynovitis, right lower leg: Secondary | ICD-10-CM | POA: Diagnosis not present

## 2023-02-18 DIAGNOSIS — X58XXXA Exposure to other specified factors, initial encounter: Secondary | ICD-10-CM | POA: Diagnosis not present

## 2023-02-26 DIAGNOSIS — S83281D Other tear of lateral meniscus, current injury, right knee, subsequent encounter: Secondary | ICD-10-CM | POA: Diagnosis not present
